# Patient Record
Sex: Female | Born: 1955 | Race: White | Hispanic: No | Marital: Married | Smoking: Never smoker
Health system: Southern US, Community
[De-identification: ages and names within clinical notes are randomized; demographics above are authoritative.]

## PROBLEM LIST (undated history)

## (undated) DIAGNOSIS — L570 Actinic keratosis: Secondary | ICD-10-CM

## (undated) DIAGNOSIS — E079 Disorder of thyroid, unspecified: Secondary | ICD-10-CM

## (undated) DIAGNOSIS — C4490 Unspecified malignant neoplasm of skin, unspecified: Secondary | ICD-10-CM

## (undated) DIAGNOSIS — G43909 Migraine, unspecified, not intractable, without status migrainosus: Secondary | ICD-10-CM

## (undated) DIAGNOSIS — R42 Dizziness and giddiness: Secondary | ICD-10-CM

## (undated) DIAGNOSIS — I10 Essential (primary) hypertension: Secondary | ICD-10-CM

## (undated) HISTORY — PX: CHOLECYSTECTOMY: SHX55

## (undated) HISTORY — DX: Unspecified malignant neoplasm of skin, unspecified: C44.90

## (undated) HISTORY — DX: Actinic keratosis: L57.0

---

## 2001-01-04 ENCOUNTER — Other Ambulatory Visit: Admission: RE | Admit: 2001-01-04 | Discharge: 2001-01-04 | Payer: Self-pay | Admitting: *Deleted

## 2003-03-16 ENCOUNTER — Encounter: Payer: Self-pay | Admitting: Unknown Physician Specialty

## 2003-03-16 ENCOUNTER — Ambulatory Visit (HOSPITAL_COMMUNITY): Admission: RE | Admit: 2003-03-16 | Discharge: 2003-03-16 | Payer: Self-pay | Admitting: Unknown Physician Specialty

## 2005-06-01 ENCOUNTER — Emergency Department (HOSPITAL_COMMUNITY): Admission: EM | Admit: 2005-06-01 | Discharge: 2005-06-01 | Payer: Self-pay | Admitting: Emergency Medicine

## 2005-12-12 ENCOUNTER — Ambulatory Visit (HOSPITAL_COMMUNITY): Admission: RE | Admit: 2005-12-12 | Discharge: 2005-12-12 | Payer: Self-pay | Admitting: Family Medicine

## 2006-12-15 ENCOUNTER — Ambulatory Visit (HOSPITAL_COMMUNITY): Admission: RE | Admit: 2006-12-15 | Discharge: 2006-12-15 | Payer: Self-pay | Admitting: Unknown Physician Specialty

## 2007-08-20 ENCOUNTER — Ambulatory Visit (HOSPITAL_COMMUNITY): Admission: RE | Admit: 2007-08-20 | Discharge: 2007-08-20 | Payer: Self-pay | Admitting: Unknown Physician Specialty

## 2007-10-01 ENCOUNTER — Ambulatory Visit (HOSPITAL_COMMUNITY): Admission: RE | Admit: 2007-10-01 | Discharge: 2007-10-01 | Payer: Self-pay | Admitting: Family Medicine

## 2007-12-21 ENCOUNTER — Ambulatory Visit (HOSPITAL_COMMUNITY): Admission: RE | Admit: 2007-12-21 | Discharge: 2007-12-21 | Payer: Self-pay | Admitting: Unknown Physician Specialty

## 2008-12-27 ENCOUNTER — Ambulatory Visit (HOSPITAL_COMMUNITY): Admission: RE | Admit: 2008-12-27 | Discharge: 2008-12-27 | Payer: Self-pay | Admitting: Unknown Physician Specialty

## 2009-12-19 ENCOUNTER — Encounter: Payer: Self-pay | Admitting: Internal Medicine

## 2009-12-31 ENCOUNTER — Ambulatory Visit (HOSPITAL_COMMUNITY): Admission: RE | Admit: 2009-12-31 | Discharge: 2009-12-31 | Payer: Self-pay | Admitting: Unknown Physician Specialty

## 2010-01-07 ENCOUNTER — Encounter (INDEPENDENT_AMBULATORY_CARE_PROVIDER_SITE_OTHER): Payer: Self-pay | Admitting: *Deleted

## 2010-02-01 ENCOUNTER — Ambulatory Visit (HOSPITAL_COMMUNITY): Admission: RE | Admit: 2010-02-01 | Discharge: 2010-02-01 | Payer: Self-pay | Admitting: Internal Medicine

## 2010-02-06 ENCOUNTER — Ambulatory Visit (HOSPITAL_COMMUNITY): Admission: RE | Admit: 2010-02-06 | Discharge: 2010-02-06 | Payer: Self-pay | Admitting: Internal Medicine

## 2010-02-21 ENCOUNTER — Ambulatory Visit (HOSPITAL_COMMUNITY): Admission: RE | Admit: 2010-02-21 | Discharge: 2010-02-21 | Payer: Self-pay | Admitting: Internal Medicine

## 2010-02-22 ENCOUNTER — Ambulatory Visit: Payer: Self-pay | Admitting: Internal Medicine

## 2010-02-22 DIAGNOSIS — K5900 Constipation, unspecified: Secondary | ICD-10-CM | POA: Insufficient documentation

## 2010-04-16 ENCOUNTER — Ambulatory Visit: Payer: Self-pay | Admitting: Internal Medicine

## 2010-04-17 ENCOUNTER — Encounter: Payer: Self-pay | Admitting: Internal Medicine

## 2010-06-30 ENCOUNTER — Encounter: Payer: Self-pay | Admitting: Internal Medicine

## 2010-07-09 NOTE — Letter (Signed)
Summary: Patient Notice- Polyp Results  [facility]  [address] [place], [place] [address]   Phone: [phone]  Fax: [phone]        April 17, 2010 MRN: [id number]    [name] [address] [place], [place]  [address]    Dear Ms. Difrancesco,  I am pleased to inform you that the colon polyp(s) removed during your recent colonoscopy was (were) found to be benign (no cancer detected) upon pathologic examination.  I recommend you have a repeat colonoscopy examination in 10 years to look for recurrent polyps, as having colon polyps increases your risk for having recurrent polyps or even colon cancer in the future.  Should you develop new or worsening symptoms of abdominal pain, bowel habit changes or bleeding from the rectum or bowels, please schedule an evaluation with either your primary care physician or with me.  Additional information/recommendations:  __ No further action with gastroenterology is needed at this time. Please      follow-up with your primary care physician for your other healthcare      needs.    Please call us if you are having persistent problems or have questions about your condition that have not been fully answered at this time.  Sincerely,  Hilarie Fredrickson MD  This letter has been electronically signed by your physician.  Appended Document: Patient Notice- Polyp Results letter mailed

## 2010-07-09 NOTE — Letter (Signed)
Summary: New Patient letter  [facility]  [address] [place], [place] [address]   Phone: [phone]  Fax: [phone]       01/07/2010 MRN: [id number]  Shelly Wood [address] [place], [place]  [address]  Dear Shelly Wood,  Welcome to the Gastroenterology Division at [facility].    You are scheduled to see Dr. Marina Goodell on 9/16/2011at 3:30PM on the 3rd floor at [facility], [address]  We ask that you try to arrive at our office 15 minutes prior to your appointment time to allow for check-in.  We would like you to complete the enclosed self-administered evaluation form prior to your visit and bring it with you on the day of your appointment.  We will review it with you.  Also, please bring a complete list of all your medications or, if you prefer, bring the medication bottles and we will list them.  Please bring your insurance card so that we may make a copy of it.  If your insurance requires a referral to see a specialist, please bring your referral form from your primary care physician.  Co-payments are due at the time of your visit and may be paid by cash, check or credit card.     Your office visit will consist of a consult with your physician (includes a physical exam), any laboratory testing he/she may order, scheduling of any necessary diagnostic testing (e.g. x-ray, ultrasound, CT-scan), and scheduling of a procedure (e.g. Endoscopy, Colonoscopy) if required.  Please allow enough time on your schedule to allow for any/all of these possibilities.    If you cannot keep your appointment, please call [phone] to cancel or reschedule prior to your appointment date.  This allows [place] the opportunity to schedule an appointment for another patient in need of care.  If you do not cancel or reschedule by 5 p.m. the business day prior to your appointment date, you will be charged a $50.00 late cancellation/no-show fee.    Thank you for choosing  [facility] for your medical needs.  We appreciate the opportunity to care for you.  Please visit [place] at our website  to learn more about our practice.                     Sincerely,                                                             The Gastroenterology Division

## 2010-07-09 NOTE — Letter (Signed)
Summary: [facility] Instructions  [facility]  [address] [place], [place] [address]   Phone: [phone]  Fax: [phone]       Shelly Wood    [birth year]    MRN: [id number]        Procedure Day /Date:TUESDAY, 04/16/10     Arrival Time:10:00 AM     Procedure Time:11:00 AM     Location of Procedure:                    X  [facility] (4th Floor)       PREPARATION FOR COLONOSCOPY WITH MOVIPREP   Starting 5 days prior to your procedure 11/3/11do not eat nuts, seeds, popcorn, corn, beans, peas,  salads, or any raw vegetables.  Do not take any fiber supplements (e.g. Metamucil, Citrucel, and Benefiber).  THE DAY BEFORE YOUR PROCEDURE         DATE:04/15/10  DAY: MONDAY  1.  Drink clear liquids the entire day-NO SOLID FOOD  2.  Do not drink anything colored red or purple.  Avoid juices with pulp.  No orange juice.  3.  Drink at least 64 oz. (8 glasses) of fluid/clear liquids during the day to prevent dehydration and help the prep work efficiently.  CLEAR LIQUIDS INCLUDE: Water Jello Ice Popsicles Tea (sugar ok, no milk/cream) Powdered fruit flavored drinks Coffee (sugar ok, no milk/cream) Gatorade Juice: apple, white grape, white cranberry  Lemonade Clear bullion, consomm, broth Carbonated beverages (any kind) Strained chicken noodle soup Hard Candy                             4.  In the morning, mix first dose of MoviPrep solution:    Empty 1 Pouch A and 1 Pouch B into the disposable container    Add lukewarm drinking water to the top line of the container. Mix to dissolve    Refrigerate (mixed solution should be used within 24 hrs)  5.  Begin drinking the prep at 5:00 p.m. The MoviPrep container is divided by 4 marks.   Every 15 minutes drink the solution down to the next mark (approximately 8 oz) until the full liter is complete.   6.  Follow completed prep with 16 oz of clear liquid of your choice (Nothing red or purple).  Continue  to drink clear liquids until bedtime.  7.  Before going to bed, mix second dose of MoviPrep solution:    Empty 1 Pouch A and 1 Pouch B into the disposable container    Add lukewarm drinking water to the top line of the container. Mix to dissolve    Refrigerate  THE DAY OF YOUR PROCEDURE      DATE: 11/8/11DAY: TUESDAY  Beginning at 6:00 am (5 hours before procedure):         1. Every 15 minutes, drink the solution down to the next mark (approx 8 oz) until the full liter is complete.  2. Follow completed prep with 16 oz. of clear liquid of your choice.    3. You may drink clear liquids until 9;00 AM (2 HOURS BEFORE PROCEDURE).   MEDICATION INSTRUCTIONS  Unless otherwise instructed, you should take regular prescription medications with a small sip of water   as early as possible the morning of your procedure.         OTHER INSTRUCTIONS  You will need a responsible adult at least 55 years of  age to accompany you and drive you home.   This person must remain in the waiting room during your procedure.  Wear loose fitting clothing that is easily removed.  Leave jewelry and other valuables at home.  However, you may wish to bring a book to read or  an iPod/MP3 player to listen to music as you wait for your procedure to start.  Remove all body piercing jewelry and leave at home.  Total time from sign-in until discharge is approximately 2-3 hours.  You should go home directly after your procedure and rest.  You can resume normal activities the  day after your procedure.  The day of your procedure you should not:   Drive   Make legal decisions   Operate machinery   Drink alcohol   Return to work  You will receive specific instructions about eating, activities and medications before you leave.    The above instructions have been reviewed and explained to me by   _______________________    I fully understand and can verbalize these instructions  _____________________________ Date _________

## 2010-07-09 NOTE — Procedures (Signed)
Summary: Colonoscopy  Patient: Shelly Wood Note: All result statuses are Final unless otherwise noted.  Tests: (1) Colonoscopy (COL)   COL Colonoscopy           DONE     [facility]     [address]     [place], [place]  [address]           COLONOSCOPY PROCEDURE REPORT           PATIENT:  Shelly, Wood  MR#:  [id number]     BIRTHDATE:  1956-04-12, 54 yrs. old  GENDER:  female     ENDOSCOPIST:  Wilhemina Bonito. Eda Keys, MD     REF. BY:  Artis Delay, M.D.     PROCEDURE DATE:  04/16/2010     PROCEDURE:  Colonoscopy with snare polypectomy x1     ASA CLASS:  Class II     INDICATIONS:  Routine Risk Screening     MEDICATIONS:   Fentanyl 100 mcg IV, Versed 9 mg IV, Benadryl 25 mg     IV           DESCRIPTION OF PROCEDURE:   After the risks benefits and     alternatives of the procedure were thoroughly explained, informed     consent was obtained.  Digital rectal exam was performed and     revealed no abnormalities.   The LB 180AL [id number] endoscope was     introduced through the anus and advanced to the cecum, which was     identified by both the appendix and ileocecal valve, without     limitations.Time to cecum =2:27 min.  The quality of the prep was     excellent, using MoviPrep.  The instrument was then slowly     withdrawn (time = 11:44 min) as the colon was fully examined.     <<PROCEDUREIMAGES>>           FINDINGS:  A diminutive polyp was found in the mid transverse     colon. Polyp was snared without cautery. Retrieval was successful.     snare polyp  This was otherwise a normal examination of the colon.     Retroflexed views in the rectum revealed no abnormalities.    The     scope was then withdrawn from the patient and the procedure     completed.           COMPLICATIONS:  None           ENDOSCOPIC IMPRESSION:     1) Diminutive polyp in the mid transverse colon - removed     2) Otherwise normal examination           RECOMMENDATIONS:     1) Repeat colonoscopy in 5  years if polyp adenomatous; otherwise     10 years           ______________________________     Wilhemina Bonito. Eda Keys, MD           CC:  Elfredia Nevins, MD; The Patient           n.     eSIGNED:   Wilhemina Bonito. Eda Keys at 04/16/2010 12:29 PM           Viviana Simpler, [id number]  Note: An exclamation mark (!) indicates a result that was not dispersed into the flowsheet. Document Creation Date: 04/16/2010 12:29 PM _______________________________________________________________________  (1) Order result status: Final Collection or observation date-time: 04/16/2010 12:22 Requested date-time:  Receipt date-time:  Reported date-time:  Referring Physician:   Ordering Physician: Fransico Setters [phone]) Specimen Source:  Source: Launa Grill Order Number: [id number] Lab site:   Appended Document: Colonoscopy     Procedures Next Due Date:    Colonoscopy: 04/2015  Appended Document: Colonoscopy     Procedures Next Due Date:    Colonoscopy: 04/2020

## 2010-07-09 NOTE — Assessment & Plan Note (Signed)
Summary: CONSTIPATION / COLONOSCOPY   History of Present Illness Visit Type: new patient Primary GI MD: Yancey Flemings MD Primary Provider: Artis Delay, MD Chief Complaint: constipation History of Present Illness:   55 year old white female with a history of hyperlipidemia, hypothyroidism, and anxiety. She presents today regarding chronic constipation and screening colonoscopy. She is accompanied by her husband. She reports long-standing intermittent problems with mild constipation for which she occasionally takes laxatives. No associated abdominal pain. No bleeding. No change in weight. She denies family history of colon cancer.   GI Review of Systems      Denies abdominal pain, acid reflux, belching, bloating, chest pain, dysphagia with liquids, dysphagia with solids, heartburn, loss of appetite, nausea, vomiting, vomiting blood, weight loss, and  weight gain.      Reports constipation.    Preventive Screening-Counseling & Management  Alcohol-Tobacco     Smoking Status: quit      Drug Use:  no.      Current Medications (verified): 1)  Aspirin 81 Mg Tabs (Aspirin) .... Take 1 Tablet By Mouth Once A Day 2)  Levothyroxine Sodium 75 Mcg Tabs (Levothyroxine Sodium) .... Take 1 Tablet By Mouth Once A Day 3)  Venlafaxine Hcl 150 Mg Xr24h-Tab (Venlafaxine Hcl) .... Take 1 Tablet By Mouth Once A Day 4)  Diazepam 10 Mg Tabs (Diazepam) .... Take 1 Tablet By Mouth Once A Day 5)  Hydrocodone-Acetaminophen 7.5-650 Mg Tabs (Hydrocodone-Acetaminophen) .... Take 1 Tablet By Mouth As Needed 6)  Fioricet 50-325-40 Mg Tabs (Butalbital-Apap-Caffeine) .... Take As Needed 7)  Lovaza 1 Gm Caps (Omega-3-Acid Ethyl Esters) .... Take 1 Capsule By Mouth Two Times A Day  Allergies (verified): No Known Drug Allergies  Past History:  Past Medical History: Hyperlipidemia Hypothyroidism  Past Surgical History: Cholecystectomy Tubal Ligation  Family History: Family History of Breast Cancer: Mat.  Aunt No FH of Colon Cancer:  Social History: Married, 1 boy Antek Patient is a former smoker.  Alcohol Use - no Daily Caffeine Use--coffee Illicit Drug Use - no Smoking Status:  quit Drug Use:  no  Review of Systems  The patient denies allergy/sinus, anemia, anxiety-new, arthritis/joint pain, back pain, blood in urine, breast changes/lumps, confusion, cough, coughing up blood, depression-new, fainting, fatigue, fever, headaches-new, hearing problems, heart murmur, heart rhythm changes, itching, menstrual pain, muscle pains/cramps, night sweats, nosebleeds, pregnancy symptoms, shortness of breath, skin rash, sleeping problems, sore throat, swelling of feet/legs, swollen lymph glands, thirst - excessive, urination - excessive, urination changes/pain, urine leakage, vision changes, and voice change.    Vital Signs:  Patient profile:   55 year old female Height:      67 inches Weight:      157 pounds BMI:     24.68 Pulse rate:   76 / minute Pulse rhythm:   regular BP sitting:   114 / 80  (left arm) Cuff size:   regular  Vitals Entered By: Francee Piccolo CMA Duncan Dull) (February 22, 2010 3:10 PM)  Physical Exam  General:  Well developed, well nourished, no acute distress. Head:  Normocephalic and atraumatic. Eyes:  PERRLA, no icterus. Ears:  Normal auditory acuity. Nose:  No deformity, discharge,  or lesions. Mouth:  No deformity or lesions, dentition normal. Neck:  Supple; no masses or thyromegaly. Lungs:  Clear throughout to auscultation. Heart:  Regular rate and rhythm; no murmurs, rubs,  or bruits. Abdomen:  Soft, nontender and nondistended. No masses, hepatosplenomegaly or hernias noted. Normal bowel sounds. Msk:  normal posture Pulses:  Normal  pulses noted. Extremities:  no edema Neurologic:  alert oriented Skin:  no jaundice Psych:  Alert and cooperative. Normal mood and affect.   Impression & Recommendations:  Problem # 1:  CONSTIPATION (ICD-564.00) mild  functional constipation. Discussed constipation.  Plan: #1. Increase water and fiber #2. Stool softeners #3. Gentle laxative p.r.n. #4. Brochure on constipation provided  Problem # 2:  SPECIAL SCREENING FOR MALIGNANT NEOPLASMS COLON (ICD-V76.51) the patient is at baseline risk. She is an appropriate candidate for screening colonoscopy without contraindication. The nature of the procedure as well as the risks, benefits, and alternatives were reviewed. She understood and agreed to proceed. Movi prep prescribed. The patient instructed on its use  Other Orders: Colonoscopy (Colon)  Patient Instructions: 1)  Colonoscopy [facility] 04/16/10 11:00 am arrive at 10:00 am 2)  Movi prep instructions given to patient/Rx. sent to pharmacy. 3)  Constipation brochure given.  4)  Colonoscopy and Flexible Sigmoidoscopy brochure given.  5)  Copy sent to : Artis Delay, MD 6)  The medication list was reviewed and reconciled.  All changed / newly prescribed medications were explained.  A complete medication list was provided to the patient / caregiver. Prescriptions: MOVIPREP 100 GM  SOLR (PEG-KCL-NACL-NASULF-NA ASC-C) As per prep instructions.  #1 x 0   Entered by:   Milford Cage NCMA   Authorized by:   Hilarie Fredrickson MD   Signed by:   Milford Cage NCMA on 02/22/2010   Method used:   Electronically to        [facility] (retail)       [address]       [place], [place]  [address]       Ph: [phone] or [phone]       Fax: [phone]   RxID:   [id number]

## 2010-07-09 NOTE — Letter (Signed)
Summary: Internal Other/triage  Internal Other/triage   Imported By: Cloria Spring LPN 16/03/9603 54:09:81  _____________________________________________________________________  External Attachment:    Type:   Image     Comment:   External Document

## 2011-01-13 ENCOUNTER — Other Ambulatory Visit (HOSPITAL_COMMUNITY): Payer: Self-pay | Admitting: Unknown Physician Specialty

## 2011-01-13 DIAGNOSIS — Z139 Encounter for screening, unspecified: Secondary | ICD-10-CM

## 2011-01-20 ENCOUNTER — Ambulatory Visit (HOSPITAL_COMMUNITY)
Admission: RE | Admit: 2011-01-20 | Discharge: 2011-01-20 | Disposition: A | Discharge status: Home / Self Care | Payer: Managed Care, Other (non HMO) | Source: Ambulatory Visit | Attending: Unknown Physician Specialty | Admitting: Unknown Physician Specialty

## 2011-01-20 DIAGNOSIS — Z139 Encounter for screening, unspecified: Secondary | ICD-10-CM

## 2011-01-20 DIAGNOSIS — Z1231 Encounter for screening mammogram for malignant neoplasm of breast: Secondary | ICD-10-CM | POA: Insufficient documentation

## 2012-02-19 ENCOUNTER — Other Ambulatory Visit (HOSPITAL_COMMUNITY): Payer: Self-pay | Admitting: Unknown Physician Specialty

## 2012-02-19 DIAGNOSIS — Z139 Encounter for screening, unspecified: Secondary | ICD-10-CM

## 2012-03-15 ENCOUNTER — Ambulatory Visit (HOSPITAL_COMMUNITY)
Admission: RE | Admit: 2012-03-15 | Discharge: 2012-03-15 | Disposition: A | Discharge status: Home / Self Care | Payer: Managed Care, Other (non HMO) | Source: Ambulatory Visit | Attending: Unknown Physician Specialty | Admitting: Unknown Physician Specialty

## 2012-03-15 DIAGNOSIS — Z1231 Encounter for screening mammogram for malignant neoplasm of breast: Secondary | ICD-10-CM | POA: Insufficient documentation

## 2012-03-15 DIAGNOSIS — Z139 Encounter for screening, unspecified: Secondary | ICD-10-CM

## 2012-10-11 ENCOUNTER — Other Ambulatory Visit (HOSPITAL_COMMUNITY): Payer: Self-pay | Admitting: Internal Medicine

## 2012-10-11 DIAGNOSIS — R1013 Epigastric pain: Secondary | ICD-10-CM

## 2012-10-13 ENCOUNTER — Ambulatory Visit (HOSPITAL_COMMUNITY)
Admission: RE | Admit: 2012-10-13 | Discharge: 2012-10-13 | Disposition: A | Discharge status: Home / Self Care | Payer: Managed Care, Other (non HMO) | Source: Ambulatory Visit | Attending: Internal Medicine | Admitting: Internal Medicine

## 2012-10-13 DIAGNOSIS — R1013 Epigastric pain: Secondary | ICD-10-CM | POA: Insufficient documentation

## 2012-10-14 ENCOUNTER — Other Ambulatory Visit (HOSPITAL_COMMUNITY): Payer: Managed Care, Other (non HMO)

## 2012-10-15 ENCOUNTER — Other Ambulatory Visit (HOSPITAL_COMMUNITY): Payer: Self-pay | Admitting: Internal Medicine

## 2012-10-15 DIAGNOSIS — R1011 Right upper quadrant pain: Secondary | ICD-10-CM

## 2012-10-15 DIAGNOSIS — R319 Hematuria, unspecified: Secondary | ICD-10-CM

## 2012-10-19 ENCOUNTER — Encounter (HOSPITAL_COMMUNITY): Payer: Self-pay

## 2012-10-19 ENCOUNTER — Ambulatory Visit (HOSPITAL_COMMUNITY)
Admission: RE | Admit: 2012-10-19 | Discharge: 2012-10-19 | Disposition: A | Discharge status: Home / Self Care | Payer: Managed Care, Other (non HMO) | Source: Ambulatory Visit | Attending: Internal Medicine | Admitting: Internal Medicine

## 2012-10-19 DIAGNOSIS — R1031 Right lower quadrant pain: Secondary | ICD-10-CM | POA: Insufficient documentation

## 2012-10-19 DIAGNOSIS — R1011 Right upper quadrant pain: Secondary | ICD-10-CM | POA: Insufficient documentation

## 2012-10-19 DIAGNOSIS — I10 Essential (primary) hypertension: Secondary | ICD-10-CM | POA: Insufficient documentation

## 2012-10-19 DIAGNOSIS — R319 Hematuria, unspecified: Secondary | ICD-10-CM

## 2012-10-19 HISTORY — DX: Essential (primary) hypertension: I10

## 2012-10-19 MED ORDER — IOHEXOL 300 MG/ML  SOLN
100.0000 mL | Freq: Once | INTRAMUSCULAR | Status: AC | PRN
  Administered 2012-10-19: 100 mL via INTRAVENOUS

## 2013-03-23 ENCOUNTER — Other Ambulatory Visit (HOSPITAL_COMMUNITY): Payer: Self-pay | Admitting: Unknown Physician Specialty

## 2013-03-23 DIAGNOSIS — Z139 Encounter for screening, unspecified: Secondary | ICD-10-CM

## 2013-04-04 ENCOUNTER — Ambulatory Visit (HOSPITAL_COMMUNITY): Payer: Managed Care, Other (non HMO)

## 2013-04-18 ENCOUNTER — Ambulatory Visit (HOSPITAL_COMMUNITY)
Admission: RE | Admit: 2013-04-18 | Discharge: 2013-04-18 | Disposition: A | Discharge status: Home / Self Care | Payer: Managed Care, Other (non HMO) | Source: Ambulatory Visit | Attending: Unknown Physician Specialty | Admitting: Unknown Physician Specialty

## 2013-04-18 DIAGNOSIS — Z139 Encounter for screening, unspecified: Secondary | ICD-10-CM

## 2013-04-18 DIAGNOSIS — Z1231 Encounter for screening mammogram for malignant neoplasm of breast: Secondary | ICD-10-CM | POA: Insufficient documentation

## 2014-04-12 ENCOUNTER — Other Ambulatory Visit (HOSPITAL_COMMUNITY): Payer: Self-pay | Admitting: Unknown Physician Specialty

## 2014-04-12 DIAGNOSIS — Z1231 Encounter for screening mammogram for malignant neoplasm of breast: Secondary | ICD-10-CM

## 2014-04-24 ENCOUNTER — Ambulatory Visit (HOSPITAL_COMMUNITY)
Admission: RE | Admit: 2014-04-24 | Discharge: 2014-04-24 | Disposition: A | Discharge status: Home / Self Care | Payer: Managed Care, Other (non HMO) | Source: Ambulatory Visit | Attending: Unknown Physician Specialty | Admitting: Unknown Physician Specialty

## 2014-04-24 DIAGNOSIS — Z1231 Encounter for screening mammogram for malignant neoplasm of breast: Secondary | ICD-10-CM

## 2014-07-27 ENCOUNTER — Ambulatory Visit (INDEPENDENT_AMBULATORY_CARE_PROVIDER_SITE_OTHER): Payer: Managed Care, Other (non HMO) | Admitting: Otolaryngology

## 2014-07-27 DIAGNOSIS — H903 Sensorineural hearing loss, bilateral: Secondary | ICD-10-CM

## 2014-07-27 DIAGNOSIS — R42 Dizziness and giddiness: Secondary | ICD-10-CM

## 2014-08-01 ENCOUNTER — Other Ambulatory Visit (INDEPENDENT_AMBULATORY_CARE_PROVIDER_SITE_OTHER): Payer: Self-pay | Admitting: Otolaryngology

## 2014-08-01 DIAGNOSIS — R42 Dizziness and giddiness: Secondary | ICD-10-CM

## 2014-08-08 ENCOUNTER — Ambulatory Visit (HOSPITAL_COMMUNITY)
Admission: RE | Admit: 2014-08-08 | Discharge: 2014-08-08 | Disposition: A | Discharge status: Home / Self Care | Payer: Managed Care, Other (non HMO) | Source: Ambulatory Visit | Attending: Otolaryngology | Admitting: Otolaryngology

## 2014-08-08 DIAGNOSIS — G939 Disorder of brain, unspecified: Secondary | ICD-10-CM | POA: Diagnosis not present

## 2014-08-08 DIAGNOSIS — R42 Dizziness and giddiness: Secondary | ICD-10-CM | POA: Diagnosis present

## 2014-08-08 LAB — POCT I-STAT CREATININE: CREATININE: 0.9 mg/dL (ref 0.50–1.10)

## 2014-08-08 MED ORDER — GADOBENATE DIMEGLUMINE 529 MG/ML IV SOLN
14.0000 mL | Freq: Once | INTRAVENOUS | Status: AC | PRN
  Administered 2014-08-08: 14 mL via INTRAVENOUS

## 2014-08-24 ENCOUNTER — Ambulatory Visit (INDEPENDENT_AMBULATORY_CARE_PROVIDER_SITE_OTHER): Payer: Managed Care, Other (non HMO) | Admitting: Otolaryngology

## 2014-08-24 DIAGNOSIS — H903 Sensorineural hearing loss, bilateral: Secondary | ICD-10-CM

## 2014-08-24 DIAGNOSIS — R42 Dizziness and giddiness: Secondary | ICD-10-CM | POA: Diagnosis not present

## 2014-11-16 ENCOUNTER — Ambulatory Visit (INDEPENDENT_AMBULATORY_CARE_PROVIDER_SITE_OTHER): Payer: Managed Care, Other (non HMO) | Admitting: Otolaryngology

## 2015-04-03 ENCOUNTER — Other Ambulatory Visit (HOSPITAL_COMMUNITY): Payer: Self-pay | Admitting: Unknown Physician Specialty

## 2015-04-03 DIAGNOSIS — Z1231 Encounter for screening mammogram for malignant neoplasm of breast: Secondary | ICD-10-CM

## 2015-04-30 ENCOUNTER — Ambulatory Visit (HOSPITAL_COMMUNITY)
Admission: RE | Admit: 2015-04-30 | Discharge: 2015-04-30 | Disposition: A | Discharge status: Home / Self Care | Payer: Managed Care, Other (non HMO) | Source: Ambulatory Visit | Attending: Unknown Physician Specialty | Admitting: Unknown Physician Specialty

## 2015-04-30 DIAGNOSIS — Z1231 Encounter for screening mammogram for malignant neoplasm of breast: Secondary | ICD-10-CM | POA: Diagnosis present

## 2015-10-17 ENCOUNTER — Encounter: Payer: Self-pay | Admitting: Internal Medicine

## 2016-03-28 ENCOUNTER — Other Ambulatory Visit (HOSPITAL_COMMUNITY): Payer: Self-pay | Admitting: Internal Medicine

## 2016-03-28 DIAGNOSIS — Z1231 Encounter for screening mammogram for malignant neoplasm of breast: Secondary | ICD-10-CM

## 2016-05-05 ENCOUNTER — Ambulatory Visit (HOSPITAL_COMMUNITY): Payer: Managed Care, Other (non HMO)

## 2016-06-16 ENCOUNTER — Other Ambulatory Visit (HOSPITAL_COMMUNITY): Payer: Self-pay | Admitting: Unknown Physician Specialty

## 2016-06-16 DIAGNOSIS — Z1231 Encounter for screening mammogram for malignant neoplasm of breast: Secondary | ICD-10-CM

## 2016-06-30 ENCOUNTER — Ambulatory Visit (HOSPITAL_COMMUNITY)
Admission: RE | Admit: 2016-06-30 | Discharge: 2016-06-30 | Disposition: A | Discharge status: Home / Self Care | Payer: Managed Care, Other (non HMO) | Source: Ambulatory Visit | Attending: Unknown Physician Specialty | Admitting: Unknown Physician Specialty

## 2016-06-30 DIAGNOSIS — Z1231 Encounter for screening mammogram for malignant neoplasm of breast: Secondary | ICD-10-CM

## 2016-12-25 ENCOUNTER — Encounter (HOSPITAL_COMMUNITY): Payer: Self-pay

## 2016-12-25 ENCOUNTER — Emergency Department (HOSPITAL_COMMUNITY): Payer: 59

## 2016-12-25 ENCOUNTER — Emergency Department (HOSPITAL_COMMUNITY)
Admission: EM | Admit: 2016-12-25 | Discharge: 2016-12-25 | Disposition: A | Discharge status: Home / Self Care | Payer: 59 | Attending: Emergency Medicine | Admitting: Emergency Medicine

## 2016-12-25 DIAGNOSIS — I10 Essential (primary) hypertension: Secondary | ICD-10-CM | POA: Insufficient documentation

## 2016-12-25 DIAGNOSIS — E079 Disorder of thyroid, unspecified: Secondary | ICD-10-CM | POA: Insufficient documentation

## 2016-12-25 DIAGNOSIS — G43909 Migraine, unspecified, not intractable, without status migrainosus: Secondary | ICD-10-CM | POA: Insufficient documentation

## 2016-12-25 DIAGNOSIS — R55 Syncope and collapse: Secondary | ICD-10-CM | POA: Diagnosis present

## 2016-12-25 DIAGNOSIS — R5383 Other fatigue: Secondary | ICD-10-CM | POA: Diagnosis not present

## 2016-12-25 DIAGNOSIS — H81399 Other peripheral vertigo, unspecified ear: Secondary | ICD-10-CM

## 2016-12-25 DIAGNOSIS — G43809 Other migraine, not intractable, without status migrainosus: Secondary | ICD-10-CM

## 2016-12-25 HISTORY — DX: Migraine, unspecified, not intractable, without status migrainosus: G43.909

## 2016-12-25 HISTORY — DX: Disorder of thyroid, unspecified: E07.9

## 2016-12-25 HISTORY — DX: Dizziness and giddiness: R42

## 2016-12-25 LAB — URINALYSIS, ROUTINE W REFLEX MICROSCOPIC
Bilirubin Urine: NEGATIVE
GLUCOSE, UA: NEGATIVE mg/dL
Hgb urine dipstick: NEGATIVE
KETONES UR: NEGATIVE mg/dL
LEUKOCYTES UA: NEGATIVE
Nitrite: NEGATIVE
PH: 6 (ref 5.0–8.0)
Protein, ur: NEGATIVE mg/dL
Specific Gravity, Urine: 1.009 (ref 1.005–1.030)

## 2016-12-25 LAB — CBC
HEMATOCRIT: 43.1 % (ref 36.0–46.0)
Hemoglobin: 14 g/dL (ref 12.0–15.0)
MCH: 30.6 pg (ref 26.0–34.0)
MCHC: 32.5 g/dL (ref 30.0–36.0)
MCV: 94.1 fL (ref 78.0–100.0)
Platelets: 260 10*3/uL (ref 150–400)
RBC: 4.58 MIL/uL (ref 3.87–5.11)
RDW: 14 % (ref 11.5–15.5)
WBC: 7.3 10*3/uL (ref 4.0–10.5)

## 2016-12-25 LAB — HEPATIC FUNCTION PANEL
ALT: 36 U/L (ref 14–54)
AST: 38 U/L (ref 15–41)
Albumin: 3.5 g/dL (ref 3.5–5.0)
Alkaline Phosphatase: 51 U/L (ref 38–126)
Bilirubin, Direct: <0.1 mg/dL — ABNORMAL LOW (ref 0.1–0.5)
TOTAL PROTEIN: 6.8 g/dL (ref 6.5–8.1)
Total Bilirubin: 0.5 mg/dL (ref 0.3–1.2)

## 2016-12-25 LAB — BASIC METABOLIC PANEL
Anion gap: 7 (ref 5–15)
BUN: 16 mg/dL (ref 6–20)
CHLORIDE: 103 mmol/L (ref 101–111)
CO2: 30 mmol/L (ref 22–32)
Calcium: 9.1 mg/dL (ref 8.9–10.3)
Creatinine, Ser: 0.94 mg/dL (ref 0.44–1.00)
GFR calc Af Amer: >60 mL/min (ref >60)
GFR calc non Af Amer: >60 mL/min (ref >60)
Glucose, Bld: 108 mg/dL — ABNORMAL HIGH (ref 65–99)
POTASSIUM: 3.6 mmol/L (ref 3.5–5.1)
SODIUM: 140 mmol/L (ref 135–145)

## 2016-12-25 LAB — TROPONIN I: Troponin I: <0.03 ng/mL (ref <0.03)

## 2016-12-25 LAB — CBG MONITORING, ED: Glucose-Capillary: 110 mg/dL — ABNORMAL HIGH (ref 65–99)

## 2016-12-25 LAB — ETHANOL

## 2016-12-25 MED ORDER — DIPHENHYDRAMINE HCL 50 MG/ML IJ SOLN
25.0000 mg | Freq: Once | INTRAMUSCULAR | Status: AC
  Administered 2016-12-25: 25 mg via INTRAVENOUS
  Filled 2016-12-25: qty 1

## 2016-12-25 MED ORDER — DEXAMETHASONE SODIUM PHOSPHATE 4 MG/ML IJ SOLN
8.0000 mg | Freq: Once | INTRAMUSCULAR | Status: AC
  Administered 2016-12-25: 8 mg via INTRAVENOUS
  Filled 2016-12-25: qty 2

## 2016-12-25 MED ORDER — METOCLOPRAMIDE HCL 5 MG/ML IJ SOLN
10.0000 mg | Freq: Once | INTRAMUSCULAR | Status: AC
  Administered 2016-12-25: 10 mg via INTRAVENOUS
  Filled 2016-12-25: qty 2

## 2016-12-25 NOTE — ED Provider Notes (Signed)
[place] DEPT Provider Note   CSN: [id number] Arrival date & time: 12/25/16  2595     History   Chief Complaint Chief Complaint  Patient presents with  . Loss of Consciousness    HPI Shelly Wood is a 61 y.o. female.   Loss of Consciousness     Pt was seen at 0740. Per pt and her family, c/o sudden onset and resolution of one episode of syncope that occurred PTA. Pt states she woke up for work at her usual time (approximately 0530), felt lightheaded, then "passed out." Pt's husband states he found pt on the floor and "somewhat slow to respond." Pt states she now is starting to have acute flair of her chronic migraine headache as well as a flair of her vertigo. Pt states for the past several days her entire left arm has been "hurting" and she "feels weak."  Describes the headache as per her usual chronic migraine headache pain pattern for many years.  Denies headache was sudden or maximal in onset or at any time.  Denies visual changes, no focal motor weakness, no tingling/numbness in extremities, no fevers, no neck pain, no rash. Denies CP/palpitations, no SOB/cough, no abd pain, no N/V/D, no seizure activity, no confusion, no incont bowel/bladder.     Past Medical History:  Diagnosis Date  . Hypertension   . Migraine   . Thyroid disease   . Vertigo     Patient Active Problem List   Diagnosis Date Noted  . CONSTIPATION 02/22/2010    Past Surgical History:  Procedure Laterality Date  . CHOLECYSTECTOMY      OB History    No data available       Home Medications    Prior to Admission medications   Not on File    Family History No family history on file.  Social History Social History  Substance Use Topics  . Smoking status: Never Smoker  . Smokeless tobacco: Never Used  . Alcohol use No     Allergies   Patient has no known allergies.   Review of Systems Review of Systems  Cardiovascular: Positive for syncope.  ROS: Statement: All systems  negative except as marked or noted in the HPI; Constitutional: Negative for fever and chills. ; ; Eyes: Negative for eye pain, redness and discharge. ; ; ENMT: Negative for ear pain, hoarseness, nasal congestion, sinus pressure and sore throat. ; ; Cardiovascular: Negative for chest pain, palpitations, diaphoresis, dyspnea and peripheral edema. ; ; Respiratory: Negative for cough, wheezing and stridor. ; ; Gastrointestinal: Negative for nausea, vomiting, diarrhea, abdominal pain, blood in stool, hematemesis, jaundice and rectal bleeding. . ; ; Genitourinary: Negative for dysuria, flank pain and hematuria. ; ; Musculoskeletal: Negative for back pain and neck pain. Negative for swelling and trauma.; ; Skin: Negative for pruritus, rash, abrasions, blisters, bruising and skin lesion.; ; Neuro: +weakness, lightheadedness, syncope. Negative for neck stiffness. Negative for extremity weakness, paresthesias, involuntary movement, seizure        Physical Exam Updated Vital Signs BP (!) 112/56 (BP Location: Right Arm)   Pulse (!) 52   Temp (!) 97.4 F (36.3 C) (Oral)   Resp 16   SpO2 98%   07:33 Orthostatic Vital Signs JH  Orthostatic Lying   BP- Lying: 129/80  Pulse- Lying: 60      Orthostatic Sitting  BP- Sitting: 125/83  Pulse- Sitting: 54      Orthostatic Standing at 0 minutes  BP- Standing at 0 minutes: 131/81  Pulse- Standing at 0 minutes: 68     Physical Exam 0745: Physical examination:  Nursing notes reviewed; Vital signs and O2 SAT reviewed;  Constitutional: Well developed, Well nourished, Well hydrated, In no acute distress; Head:  Normocephalic, atraumatic; Eyes: EOMI, PERRL, No scleral icterus; ENMT: Mouth and pharynx normal, Mucous membranes moist; Neck: Supple, Full range of motion, No lymphadenopathy; Cardiovascular: Regular rate and rhythm, No gallop; Respiratory: Breath sounds clear & equal bilaterally, No wheezes.  Speaking full sentences with ease, Normal respiratory  effort/excursion; Chest: Nontender, Movement normal; Abdomen: Soft, Nontender, Nondistended, Normal bowel sounds; Genitourinary: No CVA tenderness; Extremities: Pulses normal, No tenderness, No edema, No calf edema or asymmetry.; Neuro: AA&Ox3, Major CN grossly intact. Speech clear.  No facial droop.  +right horizontal end gaze fatigable nystagmus. Grips equal. Strength 5/5 equal bilat UE's and LE's.  DTR 2/4 equal bilat UE's and LE's.  No gross sensory deficits.  Normal cerebellar testing bilat UE's (finger-nose) and LE's (heel-shin)..; Skin: Color normal, Warm, Dry.   ED Treatments / Results  Labs (all labs ordered are listed, but only abnormal results are displayed)   EKG  EKG Interpretation  Date/Time:  Thursday December 25 2016 06:25:14 EDT Ventricular Rate:  61 PR Interval:    QRS Duration: 95 QT Interval:  414 QTC Calculation: 417 R Axis:   110 Text Interpretation:  Sinus rhythm Right axis deviation Low voltage, precordial leads Baseline wander in lead(s) V3 V4 V5 No previous ECGs available Confirmed by Ripley Fraise [phone]) on 12/25/2016 6:44:33 AM       Radiology   Procedures Procedures (including critical care time)  Medications Ordered in ED Medications  diphenhydrAMINE (BENADRYL) injection 25 mg (not administered)  metoCLOPramide (REGLAN) injection 10 mg (not administered)  dexamethasone (DECADRON) injection 8 mg (not administered)     Initial Impression / Assessment and Plan / ED Course  I have reviewed the triage vital signs and the nursing notes.  Pertinent labs & imaging results that were available during my care of the patient were reviewed by me and considered in my medical decision making (see chart for details).  MDM Reviewed: previous chart, nursing note and vitals Reviewed previous: labs and ECG Interpretation: labs, ECG, x-ray and CT scan   Results for orders placed or performed during the hospital encounter of 15/72/62  Basic metabolic panel    Result Value Ref Range   Sodium 140 135 - 145 mmol/L   Potassium 3.6 3.5 - 5.1 mmol/L   Chloride 103 101 - 111 mmol/L   CO2 30 22 - 32 mmol/L   Glucose, Bld 108 (H) 65 - 99 mg/dL   BUN 16 6 - 20 mg/dL   Creatinine, Ser 0.94 0.44 - 1.00 mg/dL   Calcium 9.1 8.9 - 10.3 mg/dL   GFR calc non Af Amer >60 >60 mL/min   GFR calc Af Amer >60 >60 mL/min   Anion gap 7 5 - 15  CBC  Result Value Ref Range   WBC 7.3 4.0 - 10.5 K/uL   RBC 4.58 3.87 - 5.11 MIL/uL   Hemoglobin 14.0 12.0 - 15.0 g/dL   HCT 43.1 36.0 - 46.0 %   MCV 94.1 78.0 - 100.0 fL   MCH 30.6 26.0 - 34.0 pg   MCHC 32.5 30.0 - 36.0 g/dL   RDW 14.0 11.5 - 15.5 %   Platelets 260 150 - 400 K/uL  Urinalysis, Routine w reflex microscopic  Result Value Ref Range   Color, Urine YELLOW YELLOW  APPearance HAZY (A) CLEAR   Specific Gravity, Urine 1.009 1.005 - 1.030   pH 6.0 5.0 - 8.0   Glucose, UA NEGATIVE NEGATIVE mg/dL   Hgb urine dipstick NEGATIVE NEGATIVE   Bilirubin Urine NEGATIVE NEGATIVE   Ketones, ur NEGATIVE NEGATIVE mg/dL   Protein, ur NEGATIVE NEGATIVE mg/dL   Nitrite NEGATIVE NEGATIVE   Leukocytes, UA NEGATIVE NEGATIVE  Troponin I  Result Value Ref Range   Troponin I <0.03 <0.03 ng/mL  Ethanol  Result Value Ref Range   Alcohol, Ethyl (B) <5 <5 mg/dL  Hepatic function panel  Result Value Ref Range   Total Protein 6.8 6.5 - 8.1 g/dL   Albumin 3.5 3.5 - 5.0 g/dL   AST 38 15 - 41 U/L   ALT 36 14 - 54 U/L   Alkaline Phosphatase 51 38 - 126 U/L   Total Bilirubin 0.5 0.3 - 1.2 mg/dL   Bilirubin, Direct <0.1 (L) 0.1 - 0.5 mg/dL   Indirect Bilirubin NOT CALCULATED 0.3 - 0.9 mg/dL  CBG monitoring, ED  Result Value Ref Range   Glucose-Capillary 110 (H) 65 - 99 mg/dL   Dg Chest 2 View Result Date: 12/25/2016 CLINICAL DATA:  Syncope EXAM: CHEST  2 VIEW COMPARISON:  06/11/2012 FINDINGS: Normal heart size. Mild aortic tortuosity. There is no edema, consolidation, effusion, or pneumothorax. No acute osseous finding.  Cholecystectomy clips. IMPRESSION: No evidence of active disease. Electronically Signed   By: Monte Fantasia M.D.   On: 12/25/2016 08:26   Ct Head Wo Contrast Result Date: 12/25/2016 CLINICAL DATA:  Syncope and fall today. EXAM: CT HEAD WITHOUT CONTRAST TECHNIQUE: Contiguous axial images were obtained from the base of the skull through the vertex without intravenous contrast. COMPARISON:  Brain MRI 08/08/2014 FINDINGS: Brain: No evidence of acute infarction, hemorrhage, hydrocephalus, extra-axial collection or mass lesion/mass effect. 2 small areas of cortically based gliotic type appearance at the right frontal parietal junction, likely post ischemic. 1 of these was seen on 2016 brain MRI. Vascular: No hyperdense vessel or unexpected calcification. Skull: Normal. Negative for fracture or focal lesion. Sinuses/Orbits: Negative IMPRESSION: No acute finding. Electronically Signed   By: Monte Fantasia M.D.   On: 12/25/2016 08:01    1010:   Pt is not orthostatic on VS.  Meds given to tx usual migraine headache with improvement.   Pt and family informed re: dx testing results, and that I recommend admission for further evaluation.  Pt refuses admission.  I encouraged pt to stay, continues to refuse.  Pt makes her own medical decisions.  Risks of AMA explained to pt and family, including, but not limited to:  stroke, heart attack, cardiac arrythmia ("irregular heart rate/beat"), "passing out," temporary and/or permanent disability, death.  Pt and family verb understanding and continue to refuse admission, understanding the consequences of their decision.  I encouraged pt to follow up with her PMD tomorrow and return to the ED immediately if symptoms return, or for any other concerns.  Pt and family verb understanding, agreeable.    Final Clinical Impressions(s) / ED Diagnoses   Final diagnoses:  None    New Prescriptions New Prescriptions   No medications on file      Francine Graven,  DO 12/29/16 1818

## 2016-12-25 NOTE — Discharge Instructions (Signed)
Take your usual prescriptions as previously directed.  Call your regular medical doctor today to schedule a follow up appointment within the next 24 to 48 hours.  Return to the Emergency Department immediately sooner if worsening.

## 2016-12-25 NOTE — ED Triage Notes (Signed)
Pt states she got up this am and passed out, states she is not sure if she hit her head.  Her husband states he found pt on the floor and was somewhat slow to respond.  Pt states she feels very weak and is having pain to left arm since last night.

## 2016-12-26 LAB — URINE CULTURE: Culture: NO GROWTH

## 2017-03-09 ENCOUNTER — Ambulatory Visit (INDEPENDENT_AMBULATORY_CARE_PROVIDER_SITE_OTHER): Payer: 59 | Admitting: Otolaryngology

## 2017-09-08 ENCOUNTER — Other Ambulatory Visit (HOSPITAL_COMMUNITY): Payer: Self-pay | Admitting: Unknown Physician Specialty

## 2017-09-08 DIAGNOSIS — Z1231 Encounter for screening mammogram for malignant neoplasm of breast: Secondary | ICD-10-CM

## 2017-09-11 ENCOUNTER — Ambulatory Visit (HOSPITAL_COMMUNITY)
Admission: RE | Admit: 2017-09-11 | Discharge: 2017-09-11 | Disposition: A | Discharge status: Home / Self Care | Payer: 59 | Source: Ambulatory Visit | Attending: Unknown Physician Specialty | Admitting: Unknown Physician Specialty

## 2017-09-11 DIAGNOSIS — Z1231 Encounter for screening mammogram for malignant neoplasm of breast: Secondary | ICD-10-CM

## 2017-11-09 DIAGNOSIS — W57XXXA Bitten or stung by nonvenomous insect and other nonvenomous arthropods, initial encounter: Secondary | ICD-10-CM | POA: Diagnosis not present

## 2017-11-09 DIAGNOSIS — R21 Rash and other nonspecific skin eruption: Secondary | ICD-10-CM | POA: Diagnosis not present

## 2017-11-09 DIAGNOSIS — A6920 Lyme disease, unspecified: Secondary | ICD-10-CM | POA: Diagnosis not present

## 2018-01-25 DIAGNOSIS — Z0000 Encounter for general adult medical examination without abnormal findings: Secondary | ICD-10-CM | POA: Diagnosis not present

## 2018-01-27 DIAGNOSIS — Z6826 Body mass index (BMI) 26.0-26.9, adult: Secondary | ICD-10-CM | POA: Diagnosis not present

## 2018-01-27 DIAGNOSIS — Z01419 Encounter for gynecological examination (general) (routine) without abnormal findings: Secondary | ICD-10-CM | POA: Diagnosis not present

## 2018-01-27 DIAGNOSIS — Z79890 Hormone replacement therapy (postmenopausal): Secondary | ICD-10-CM | POA: Diagnosis not present

## 2018-03-17 ENCOUNTER — Ambulatory Visit: Payer: 59

## 2018-04-01 DIAGNOSIS — Z1389 Encounter for screening for other disorder: Secondary | ICD-10-CM | POA: Diagnosis not present

## 2018-04-01 DIAGNOSIS — Z6827 Body mass index (BMI) 27.0-27.9, adult: Secondary | ICD-10-CM | POA: Diagnosis not present

## 2018-04-01 DIAGNOSIS — H8113 Benign paroxysmal vertigo, bilateral: Secondary | ICD-10-CM | POA: Diagnosis not present

## 2018-04-01 DIAGNOSIS — E063 Autoimmune thyroiditis: Secondary | ICD-10-CM | POA: Diagnosis not present

## 2018-04-01 DIAGNOSIS — I10 Essential (primary) hypertension: Secondary | ICD-10-CM | POA: Diagnosis not present

## 2018-04-20 DIAGNOSIS — Z0000 Encounter for general adult medical examination without abnormal findings: Secondary | ICD-10-CM | POA: Diagnosis not present

## 2018-04-20 DIAGNOSIS — Z1389 Encounter for screening for other disorder: Secondary | ICD-10-CM | POA: Diagnosis not present

## 2018-04-20 DIAGNOSIS — I10 Essential (primary) hypertension: Secondary | ICD-10-CM | POA: Diagnosis not present

## 2018-04-20 DIAGNOSIS — E663 Overweight: Secondary | ICD-10-CM | POA: Diagnosis not present

## 2018-04-20 DIAGNOSIS — F329 Major depressive disorder, single episode, unspecified: Secondary | ICD-10-CM | POA: Diagnosis not present

## 2018-11-03 ENCOUNTER — Other Ambulatory Visit (HOSPITAL_COMMUNITY): Payer: Self-pay | Admitting: Unknown Physician Specialty

## 2018-11-03 DIAGNOSIS — Z1231 Encounter for screening mammogram for malignant neoplasm of breast: Secondary | ICD-10-CM

## 2018-11-08 DIAGNOSIS — D0461 Carcinoma in situ of skin of right upper limb, including shoulder: Secondary | ICD-10-CM | POA: Diagnosis not present

## 2018-11-08 DIAGNOSIS — L718 Other rosacea: Secondary | ICD-10-CM | POA: Diagnosis not present

## 2018-11-10 ENCOUNTER — Other Ambulatory Visit: Payer: Self-pay

## 2018-11-10 ENCOUNTER — Ambulatory Visit (HOSPITAL_COMMUNITY)
Admission: RE | Admit: 2018-11-10 | Discharge: 2018-11-10 | Disposition: A | Discharge status: Home / Self Care | Payer: BC Managed Care – PPO | Source: Ambulatory Visit | Attending: Unknown Physician Specialty | Admitting: Unknown Physician Specialty

## 2018-11-10 DIAGNOSIS — Z1231 Encounter for screening mammogram for malignant neoplasm of breast: Secondary | ICD-10-CM | POA: Insufficient documentation

## 2018-12-09 DIAGNOSIS — Z85828 Personal history of other malignant neoplasm of skin: Secondary | ICD-10-CM | POA: Diagnosis not present

## 2018-12-09 DIAGNOSIS — Z08 Encounter for follow-up examination after completed treatment for malignant neoplasm: Secondary | ICD-10-CM | POA: Diagnosis not present

## 2019-03-16 DIAGNOSIS — Z01419 Encounter for gynecological examination (general) (routine) without abnormal findings: Secondary | ICD-10-CM | POA: Diagnosis not present

## 2019-03-16 DIAGNOSIS — Z6828 Body mass index (BMI) 28.0-28.9, adult: Secondary | ICD-10-CM | POA: Diagnosis not present

## 2019-03-16 DIAGNOSIS — Z79890 Hormone replacement therapy (postmenopausal): Secondary | ICD-10-CM | POA: Diagnosis not present

## 2019-04-08 ENCOUNTER — Other Ambulatory Visit: Payer: Self-pay

## 2019-04-08 DIAGNOSIS — Z20822 Contact with and (suspected) exposure to covid-19: Secondary | ICD-10-CM

## 2019-04-10 LAB — NOVEL CORONAVIRUS, NAA: SARS-CoV-2, NAA: NOT DETECTED

## 2019-04-11 ENCOUNTER — Telehealth: Payer: Self-pay | Admitting: *Deleted

## 2019-04-11 NOTE — Telephone Encounter (Signed)
Patient notified of negative result- patient tested for screening- possible exposure. Advised to continue safe practices, call PCP for changes and get flu shot.

## 2019-04-25 DIAGNOSIS — Z0000 Encounter for general adult medical examination without abnormal findings: Secondary | ICD-10-CM | POA: Diagnosis not present

## 2019-04-25 DIAGNOSIS — E063 Autoimmune thyroiditis: Secondary | ICD-10-CM | POA: Diagnosis not present

## 2019-04-25 DIAGNOSIS — Z23 Encounter for immunization: Secondary | ICD-10-CM | POA: Diagnosis not present

## 2019-04-25 DIAGNOSIS — Z6828 Body mass index (BMI) 28.0-28.9, adult: Secondary | ICD-10-CM | POA: Diagnosis not present

## 2019-04-25 DIAGNOSIS — E7489 Other specified disorders of carbohydrate metabolism: Secondary | ICD-10-CM | POA: Diagnosis not present

## 2019-04-25 DIAGNOSIS — E663 Overweight: Secondary | ICD-10-CM | POA: Diagnosis not present

## 2019-04-25 DIAGNOSIS — I10 Essential (primary) hypertension: Secondary | ICD-10-CM | POA: Diagnosis not present

## 2019-05-17 DIAGNOSIS — I10 Essential (primary) hypertension: Secondary | ICD-10-CM | POA: Diagnosis not present

## 2019-05-17 DIAGNOSIS — E038 Other specified hypothyroidism: Secondary | ICD-10-CM | POA: Diagnosis not present

## 2019-05-17 DIAGNOSIS — R202 Paresthesia of skin: Secondary | ICD-10-CM | POA: Diagnosis not present

## 2019-05-17 DIAGNOSIS — G43C0 Periodic headache syndromes in child or adult, not intractable: Secondary | ICD-10-CM | POA: Diagnosis not present

## 2019-05-19 DIAGNOSIS — N1831 Chronic kidney disease, stage 3a: Secondary | ICD-10-CM | POA: Diagnosis not present

## 2019-05-19 DIAGNOSIS — E063 Autoimmune thyroiditis: Secondary | ICD-10-CM | POA: Diagnosis not present

## 2019-05-19 DIAGNOSIS — I129 Hypertensive chronic kidney disease with stage 1 through stage 4 chronic kidney disease, or unspecified chronic kidney disease: Secondary | ICD-10-CM | POA: Diagnosis not present

## 2019-05-19 DIAGNOSIS — R319 Hematuria, unspecified: Secondary | ICD-10-CM | POA: Diagnosis not present

## 2019-09-22 ENCOUNTER — Other Ambulatory Visit (HOSPITAL_COMMUNITY): Payer: Self-pay | Admitting: Internal Medicine

## 2019-09-22 DIAGNOSIS — E2839 Other primary ovarian failure: Secondary | ICD-10-CM

## 2019-09-30 ENCOUNTER — Inpatient Hospital Stay (HOSPITAL_COMMUNITY): Admission: RE | Admit: 2019-09-30 | Payer: BC Managed Care – PPO | Source: Ambulatory Visit

## 2019-10-19 ENCOUNTER — Other Ambulatory Visit (HOSPITAL_COMMUNITY): Payer: Self-pay

## 2019-10-27 ENCOUNTER — Ambulatory Visit (HOSPITAL_COMMUNITY)
Admission: RE | Admit: 2019-10-27 | Discharge: 2019-10-27 | Disposition: A | Discharge status: Home / Self Care | Payer: 59 | Source: Ambulatory Visit | Attending: Internal Medicine | Admitting: Internal Medicine

## 2019-10-27 ENCOUNTER — Other Ambulatory Visit: Payer: Self-pay

## 2019-10-27 DIAGNOSIS — E2839 Other primary ovarian failure: Secondary | ICD-10-CM | POA: Diagnosis present

## 2019-11-14 ENCOUNTER — Other Ambulatory Visit (HOSPITAL_COMMUNITY): Payer: Self-pay | Admitting: Internal Medicine

## 2019-11-14 DIAGNOSIS — Z1231 Encounter for screening mammogram for malignant neoplasm of breast: Secondary | ICD-10-CM

## 2019-12-02 ENCOUNTER — Other Ambulatory Visit: Payer: Self-pay

## 2019-12-02 ENCOUNTER — Ambulatory Visit (HOSPITAL_COMMUNITY)
Admission: RE | Admit: 2019-12-02 | Discharge: 2019-12-02 | Disposition: A | Discharge status: Home / Self Care | Payer: 59 | Source: Ambulatory Visit | Attending: Internal Medicine | Admitting: Internal Medicine

## 2019-12-02 DIAGNOSIS — Z1231 Encounter for screening mammogram for malignant neoplasm of breast: Secondary | ICD-10-CM | POA: Diagnosis present

## 2019-12-07 ENCOUNTER — Other Ambulatory Visit (HOSPITAL_COMMUNITY): Payer: Self-pay | Admitting: Internal Medicine

## 2019-12-07 DIAGNOSIS — R928 Other abnormal and inconclusive findings on diagnostic imaging of breast: Secondary | ICD-10-CM

## 2019-12-22 ENCOUNTER — Other Ambulatory Visit (HOSPITAL_COMMUNITY): Payer: Self-pay | Admitting: Internal Medicine

## 2019-12-22 ENCOUNTER — Ambulatory Visit (HOSPITAL_COMMUNITY)
Admission: RE | Admit: 2019-12-22 | Discharge: 2019-12-22 | Disposition: A | Discharge status: Home / Self Care | Payer: 59 | Source: Ambulatory Visit | Attending: Internal Medicine | Admitting: Internal Medicine

## 2019-12-22 ENCOUNTER — Other Ambulatory Visit: Payer: Self-pay

## 2019-12-22 DIAGNOSIS — R928 Other abnormal and inconclusive findings on diagnostic imaging of breast: Secondary | ICD-10-CM | POA: Insufficient documentation

## 2019-12-27 ENCOUNTER — Ambulatory Visit (HOSPITAL_COMMUNITY)
Admission: RE | Admit: 2019-12-27 | Discharge: 2019-12-27 | Disposition: A | Discharge status: Home / Self Care | Payer: 59 | Source: Ambulatory Visit | Attending: Internal Medicine | Admitting: Internal Medicine

## 2019-12-27 ENCOUNTER — Other Ambulatory Visit: Payer: Self-pay

## 2019-12-27 DIAGNOSIS — R928 Other abnormal and inconclusive findings on diagnostic imaging of breast: Secondary | ICD-10-CM | POA: Diagnosis present

## 2019-12-27 MED ORDER — LIDOCAINE HCL (PF) 2 % IJ SOLN
INTRAMUSCULAR | Status: AC
  Filled 2019-12-27: qty 10

## 2019-12-27 MED ORDER — LIDOCAINE-EPINEPHRINE (PF) 1 %-1:200000 IJ SOLN
INTRAMUSCULAR | Status: AC
  Filled 2019-12-27: qty 30

## 2020-01-10 ENCOUNTER — Ambulatory Visit (HOSPITAL_COMMUNITY)
Admission: RE | Admit: 2020-01-10 | Discharge: 2020-01-10 | Disposition: A | Discharge status: Home / Self Care | Payer: 59 | Source: Ambulatory Visit | Attending: Internal Medicine | Admitting: Internal Medicine

## 2020-01-10 ENCOUNTER — Other Ambulatory Visit (HOSPITAL_COMMUNITY): Payer: Self-pay | Admitting: Internal Medicine

## 2020-01-10 ENCOUNTER — Other Ambulatory Visit: Payer: Self-pay

## 2020-01-10 DIAGNOSIS — M5440 Lumbago with sciatica, unspecified side: Secondary | ICD-10-CM

## 2020-01-20 ENCOUNTER — Other Ambulatory Visit (HOSPITAL_COMMUNITY): Payer: Self-pay | Admitting: Internal Medicine

## 2020-01-20 ENCOUNTER — Other Ambulatory Visit: Payer: Self-pay | Admitting: Internal Medicine

## 2020-01-20 DIAGNOSIS — M5410 Radiculopathy, site unspecified: Secondary | ICD-10-CM

## 2020-02-02 ENCOUNTER — Ambulatory Visit (HOSPITAL_COMMUNITY)
Admission: RE | Admit: 2020-02-02 | Discharge: 2020-02-02 | Disposition: A | Discharge status: Home / Self Care | Payer: 59 | Source: Ambulatory Visit | Attending: Internal Medicine | Admitting: Internal Medicine

## 2020-02-02 ENCOUNTER — Other Ambulatory Visit: Payer: Self-pay

## 2020-02-02 DIAGNOSIS — M5410 Radiculopathy, site unspecified: Secondary | ICD-10-CM | POA: Insufficient documentation

## 2020-06-15 ENCOUNTER — Other Ambulatory Visit: Payer: 59

## 2020-06-19 ENCOUNTER — Telehealth: Payer: Self-pay

## 2020-06-19 NOTE — Telephone Encounter (Signed)
Pt called for results of covid test- pt went to a testing place in [place] last Friday. Advised pt that she will need to call that location for the results. Pt verbalized understanding.

## 2020-07-18 ENCOUNTER — Encounter: Payer: Self-pay | Admitting: Internal Medicine

## 2020-10-31 ENCOUNTER — Encounter (HOSPITAL_COMMUNITY): Payer: Self-pay | Admitting: Emergency Medicine

## 2020-10-31 ENCOUNTER — Emergency Department (HOSPITAL_COMMUNITY)
Admission: EM | Admit: 2020-10-31 | Discharge: 2020-10-31 | Disposition: A | Discharge status: Home / Self Care | Payer: 59 | Attending: Emergency Medicine | Admitting: Emergency Medicine

## 2020-10-31 ENCOUNTER — Other Ambulatory Visit: Payer: Self-pay

## 2020-10-31 DIAGNOSIS — I10 Essential (primary) hypertension: Secondary | ICD-10-CM | POA: Insufficient documentation

## 2020-10-31 DIAGNOSIS — T424X4A Poisoning by benzodiazepines, undetermined, initial encounter: Secondary | ICD-10-CM

## 2020-10-31 DIAGNOSIS — F419 Anxiety disorder, unspecified: Secondary | ICD-10-CM | POA: Insufficient documentation

## 2020-10-31 DIAGNOSIS — T424X1A Poisoning by benzodiazepines, accidental (unintentional), initial encounter: Secondary | ICD-10-CM | POA: Diagnosis present

## 2020-10-31 DIAGNOSIS — Y906 Blood alcohol level of 120-199 mg/100 ml: Secondary | ICD-10-CM | POA: Diagnosis not present

## 2020-10-31 DIAGNOSIS — R079 Chest pain, unspecified: Secondary | ICD-10-CM | POA: Diagnosis not present

## 2020-10-31 DIAGNOSIS — Z79899 Other long term (current) drug therapy: Secondary | ICD-10-CM | POA: Insufficient documentation

## 2020-10-31 DIAGNOSIS — R0602 Shortness of breath: Secondary | ICD-10-CM | POA: Insufficient documentation

## 2020-10-31 DIAGNOSIS — F32A Depression, unspecified: Secondary | ICD-10-CM | POA: Diagnosis not present

## 2020-10-31 DIAGNOSIS — T50904A Poisoning by unspecified drugs, medicaments and biological substances, undetermined, initial encounter: Secondary | ICD-10-CM

## 2020-10-31 LAB — CBC WITH DIFFERENTIAL/PLATELET
Abs Immature Granulocytes: 0.02 10*3/uL (ref 0.00–0.07)
Basophils Absolute: 0.1 10*3/uL (ref 0.0–0.1)
Basophils Relative: 1 %
Eosinophils Absolute: 0.2 10*3/uL (ref 0.0–0.5)
Eosinophils Relative: 2 %
HCT: 47.7 % — ABNORMAL HIGH (ref 36.0–46.0)
Hemoglobin: 15.2 g/dL — ABNORMAL HIGH (ref 12.0–15.0)
Immature Granulocytes: 0 %
Lymphocytes Relative: 52 %
Lymphs Abs: 3.3 10*3/uL (ref 0.7–4.0)
MCH: 29.9 pg (ref 26.0–34.0)
MCHC: 31.9 g/dL (ref 30.0–36.0)
MCV: 93.9 fL (ref 80.0–100.0)
Monocytes Absolute: 0.4 10*3/uL (ref 0.1–1.0)
Monocytes Relative: 6 %
Neutro Abs: 2.6 10*3/uL (ref 1.7–7.7)
Neutrophils Relative %: 39 %
Platelets: 361 10*3/uL (ref 150–400)
RBC: 5.08 MIL/uL (ref 3.87–5.11)
RDW: 13.2 % (ref 11.5–15.5)
WBC: 6.6 10*3/uL (ref 4.0–10.5)
nRBC: 0 % (ref 0.0–0.2)

## 2020-10-31 LAB — COMPREHENSIVE METABOLIC PANEL
ALT: 48 U/L — ABNORMAL HIGH (ref 0–44)
AST: 42 U/L — ABNORMAL HIGH (ref 15–41)
Albumin: 4.2 g/dL (ref 3.5–5.0)
Alkaline Phosphatase: 67 U/L (ref 38–126)
Anion gap: 9 (ref 5–15)
BUN: 11 mg/dL (ref 8–23)
CO2: 30 mmol/L (ref 22–32)
Calcium: 9.5 mg/dL (ref 8.9–10.3)
Chloride: 106 mmol/L (ref 98–111)
Creatinine, Ser: 0.79 mg/dL (ref 0.44–1.00)
GFR, Estimated: >60 mL/min (ref >60)
Glucose, Bld: 98 mg/dL (ref 70–99)
Potassium: 3.6 mmol/L (ref 3.5–5.1)
Sodium: 145 mmol/L (ref 135–145)
Total Bilirubin: 0.5 mg/dL (ref 0.3–1.2)
Total Protein: 8.1 g/dL (ref 6.5–8.1)

## 2020-10-31 LAB — TROPONIN I (HIGH SENSITIVITY)
Troponin I (High Sensitivity): 4 ng/L (ref <18)
Troponin I (High Sensitivity): 3 ng/L (ref <18)

## 2020-10-31 LAB — RAPID URINE DRUG SCREEN, HOSP PERFORMED
Amphetamines: NOT DETECTED
Barbiturates: NOT DETECTED
Benzodiazepines: POSITIVE — AB
Cocaine: NOT DETECTED
Opiates: NOT DETECTED
Tetrahydrocannabinol: NOT DETECTED

## 2020-10-31 LAB — ETHANOL: Alcohol, Ethyl (B): 151 mg/dL — ABNORMAL HIGH (ref <10)

## 2020-10-31 MED ORDER — HYDROCHLOROTHIAZIDE 25 MG PO TABS: 25.0000 mg | ORAL_TABLET | Freq: Every day | ORAL | Status: DC

## 2020-10-31 MED ORDER — LEVOTHYROXINE SODIUM 75 MCG PO TABS
75.0000 ug | ORAL_TABLET | Freq: Every day | ORAL | Status: DC
  Filled 2020-10-31 (×3): qty 1

## 2020-10-31 MED ORDER — ESCITALOPRAM OXALATE 10 MG PO TABS: 10.0000 mg | ORAL_TABLET | Freq: Every day | ORAL | Status: DC

## 2020-10-31 MED ORDER — ESTRADIOL 1 MG PO TABS
1.0000 mg | ORAL_TABLET | Freq: Every day | ORAL | Status: DC
  Filled 2020-10-31 (×3): qty 1

## 2020-10-31 MED ORDER — MEDROXYPROGESTERONE ACETATE 5 MG PO TABS
2.5000 mg | ORAL_TABLET | Freq: Every day | ORAL | Status: DC
  Filled 2020-10-31 (×3): qty 1

## 2020-10-31 MED ORDER — OXYCODONE-ACETAMINOPHEN 5-325 MG PO TABS
1.0000 | ORAL_TABLET | Freq: Once | ORAL | Status: AC
Start: 2020-10-31 — End: 2020-10-31
  Administered 2020-10-31: 1 via ORAL
  Filled 2020-10-31: qty 1

## 2020-10-31 NOTE — ED Provider Notes (Signed)
Patient seen by psychiatry team and cleared for continued outpatient care.  Advised immediate return if she has worsening symptoms or any additional concerns.    Luna Fuse, MD 10/31/20 [phone]

## 2020-10-31 NOTE — Discharge Instructions (Addendum)
Call your primary care doctor or specialist as discussed in the next 2-3 days.   Return immediately back to the ER if:  Your symptoms worsen within the next 12-24 hours. You develop new symptoms such as new fevers, persistent vomiting, new pain, shortness of breath, or new weakness or numbness, or if you have any other concerns.  

## 2020-10-31 NOTE — ED Notes (Signed)
Pt in room talking to someone on the phone . Pt is calm but anxious about her discharge plan. Nurse explained to pt she is waiting on TTS.  Nurse explained how TTS worked and stated she was unaware of exact timing for it.

## 2020-10-31 NOTE — ED Notes (Signed)
Pt called out stating that she was ready to leave. Pt was informed that she was placed on a psych hold and that she would not be able to leave until she spoke with the psych person. Pt was visibly upset and stated that she was no suicidal or homicidal and felt safe to go home. Pt requested to speak with provider.   Dr. Betsey Holiday made aware.

## 2020-10-31 NOTE — ED Notes (Signed)
TTS at this [facility]

## 2020-10-31 NOTE — ED Triage Notes (Signed)
Pt states she took 5-6 2mg  valium over several hours. Pt has been dealing with stress from martial problems. Pt denies any si/hi ideations, but states she was just trying to get through the day.

## 2020-10-31 NOTE — ED Provider Notes (Signed)
[facility] EMERGENCY DEPARTMENT Provider Note   CSN: [id number] Arrival date & time: 10/31/20  0230     History Chief Complaint  Patient presents with  . Drug Overdose    Shelly Wood is a 65 y.o. female.  Patient presents to the emergency department for evaluation of overdose.  Patient reportedly has been having problems with her husband.  She found out tonight that he has been unfaithful to her and in response to this has taken multiple doses of Valium and drank multiple beers.  Patient very distraught at arrival.  She denies that this was a suicide attempt.  Patient reportedly developed chest pain and shortness of breath after finding out this bad news as well.        Past Medical History:  Diagnosis Date  . Hypertension   . Migraine   . Thyroid disease   . Vertigo     Patient Active Problem List   Diagnosis Date Noted  . CONSTIPATION 02/22/2010    Past Surgical History:  Procedure Laterality Date  . CHOLECYSTECTOMY       OB History   No obstetric history on file.     No family history on file.  Social History   Tobacco Use  . Smoking status: Never Smoker  . Smokeless tobacco: Never Used  Substance Use Topics  . Alcohol use: No  . Drug use: No    Home Medications Prior to Admission medications   Medication Sig Start Date End Date Taking? Authorizing Provider  ACETAMINOPHEN-BUTALBITAL 50-325 MG TABS Take 1 tablet by mouth 2 (two) times daily as needed. 02/19/10   [provider]  Aspirin-Acetaminophen-Caffeine (EXCEDRIN MIGRAINE PO) Take 1 tablet by mouth as needed (pain, headaches).    [provider]  cholecalciferol (VITAMIN D) 1000 units tablet Take 1,000 Units by mouth daily.    [provider]  diazepam (VALIUM) 10 MG tablet Take 1 tablet by mouth at bedtime as needed for sleep. 10/30/16   [provider]  escitalopram (LEXAPRO) 10 MG tablet Take 10 mg by mouth daily. 11/17/16   [provider]   estradiol (ESTRACE) 1 MG tablet Take 1 tablet by mouth daily. 11/20/16   [provider]  hydrochlorothiazide (HYDRODIURIL) 25 MG tablet Take 25 mg by mouth daily. 11/16/16   [provider]  Levothyroxine Sodium 75 MCG CAPS Take 75-100 mcg by mouth daily. 02/19/10   [provider]  medroxyPROGESTERone (PROVERA) 2.5 MG tablet Take 1 tablet by mouth daily. 11/20/16   [provider]    Allergies    Patient has no known allergies.  Review of Systems   Review of Systems  Respiratory: Positive for shortness of breath.   Cardiovascular: Positive for chest pain.  Psychiatric/Behavioral: Positive for dysphoric mood. Negative for suicidal ideas. The patient is nervous/anxious.   All other systems reviewed and are negative.   Physical Exam Updated Vital Signs BP 104/66 (BP Location: Left Arm)   Pulse 95   Temp 98.4 F (36.9 C) (Oral)   Resp 16   Ht 5\' 7"  (1.702 m)   Wt 68 kg   SpO2 95%   BMI 23.48 kg/m   Physical Exam Vitals and nursing note reviewed.  Constitutional:      General: She is not in acute distress.    Appearance: Normal appearance. She is well-developed.  HENT:     Head: Normocephalic and atraumatic.     Right Ear: Hearing normal.     Left Ear: Hearing  normal.     Nose: Nose normal.  Eyes:     Conjunctiva/sclera: Conjunctivae normal.     Pupils: Pupils are equal, round, and reactive to light.  Cardiovascular:     Rate and Rhythm: Regular rhythm.     Heart sounds: S1 normal and S2 normal. No murmur heard. No friction rub. No gallop.   Pulmonary:     Effort: Pulmonary effort is normal. No respiratory distress.     Breath sounds: Normal breath sounds.  Chest:     Chest wall: No tenderness.  Abdominal:     General: Bowel sounds are normal.     Palpations: Abdomen is soft.     Tenderness: There is no abdominal tenderness. There is no guarding or rebound. Negative signs include Murphy's sign and McBurney's sign.     Hernia: No  hernia is present.  Musculoskeletal:        General: Normal range of motion.     Cervical back: Normal range of motion and neck supple.  Skin:    General: Skin is warm and dry.     Findings: No rash.  Neurological:     Mental Status: She is alert and oriented to person, place, and time.     GCS: GCS eye subscore is 4. GCS verbal subscore is 5. GCS motor subscore is 6.     Cranial Nerves: No cranial nerve deficit.     Sensory: No sensory deficit.     Coordination: Coordination normal.  Psychiatric:        Mood and Affect: Mood is anxious and depressed. Affect is tearful.        Behavior: Behavior is withdrawn.     ED Results / Procedures / Treatments   Labs (all labs ordered are listed, but only abnormal results are displayed) Labs Reviewed  CBC WITH DIFFERENTIAL/PLATELET - Abnormal; Notable for the following components:      Result Value   Hemoglobin 15.2 (*)    HCT 47.7 (*)    All other components within normal limits  COMPREHENSIVE METABOLIC PANEL - Abnormal; Notable for the following components:   AST 42 (*)    ALT 48 (*)    All other components within normal limits  ETHANOL - Abnormal; Notable for the following components:   Alcohol, Ethyl (B) 151 (*)    All other components within normal limits  RAPID URINE DRUG SCREEN, HOSP PERFORMED - Abnormal; Notable for the following components:   Benzodiazepines POSITIVE (*)    All other components within normal limits  TROPONIN I (HIGH SENSITIVITY)  TROPONIN I (HIGH SENSITIVITY)    EKG EKG Interpretation  Date/Time:  Wednesday Oct 31 2020 02:46:48 EDT Ventricular Rate:  73 PR Interval:  211 QRS Duration: 85 QT Interval:  387 QTC Calculation: 427 R Axis:   88 Text Interpretation: Sinus rhythm Borderline right axis deviation Confirmed by Orpah Greek [phone]) on 10/31/2020 3:47:15 AM   Radiology No results found.  Procedures Procedures   Medications Ordered in ED Medications - No data to display  ED  Course  I have reviewed the triage vital signs and the nursing notes.  Pertinent labs & imaging results that were available during my care of the patient were reviewed by me and considered in my medical decision making (see chart for details).    MDM Rules/Calculators/A&P                          Patient presents to  the emergency department for evaluation of possible overdose.  Patient has taken up to 5 Valium tablets and drank beer tonight.  This was in response to marital discourse.  Patient reports that she has not suicidal.  She reports that she was just "trying to get through the day".  Work-up is unremarkable.  Will require psychiatric evaluation.  Medically clear for psychiatric treatment.  Final Clinical Impression(s) / ED Diagnoses Final diagnoses:  Benzodiazepine overdose of undetermined intent, initial encounter    Rx / DC Orders ED Discharge Orders    None       Ainara Eldridge, Gwenyth Allegra, MD 10/31/20 0502

## 2020-10-31 NOTE — ED Notes (Signed)
Pt ambulated to the bathroom.  
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
## 2020-10-31 NOTE — BH Assessment (Signed)
DISPOSITION: Gave clinical report to Thomes Lolling, NP  who determined Pt does not meet criteria for inpatient psychiatric treatment. Notified Dr. Thamas Jaegers, MD and Lyn Henri , RN of disposition recommendation and the sitter utilization recommendation.

## 2021-01-11 IMAGING — MR MR LUMBAR SPINE W/O CM
4 of 5 series · 32 of 48 positions shown · non-contrast
Comparison: Lumbar radiographs 01/10/2020. CT Abdomen and Pelvis
10/19/2012.

CLINICAL DATA: 63-year-old female with low back pain radiating down
the left leg for 2 months. No known injury.

EXAM:
MRI LUMBAR SPINE WITHOUT CONTRAST
TECHNIQUE: Multiplanar, multisequence MR imaging of the lumbar spine was
performed. No intravenous contrast was administered.

[Series 11: T1 · sagittal · 4.0mm · 0.81mm/px · 7 of 15 slices shown (1 of 2)]
[im 1/15]
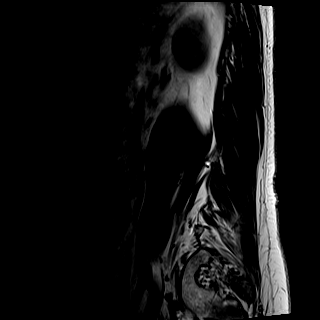
[im 3/15]
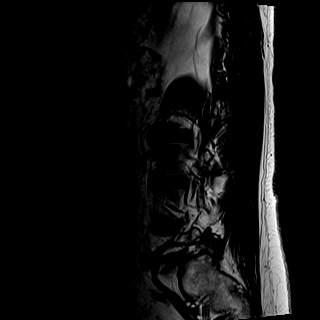
[im 5/15]
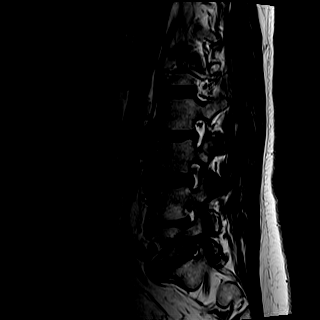
[im 8/15]
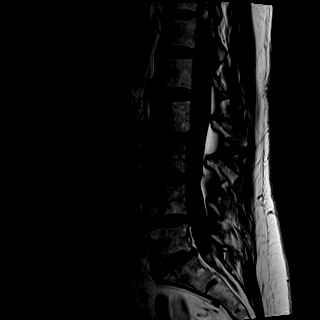
[im 10/15]
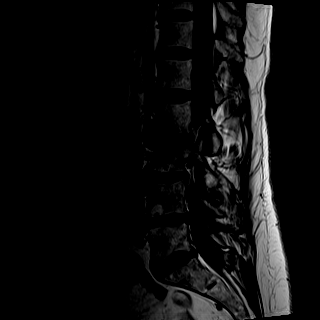
[im 12/15]
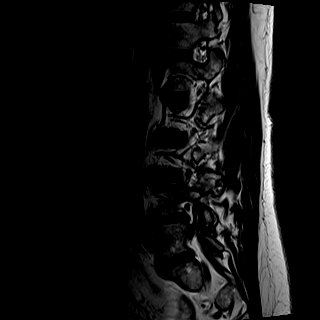
[im 15/15]
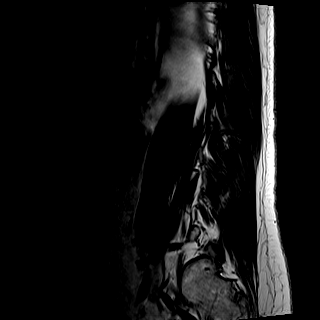

[Series 12: T2 · sagittal · 4.0mm · 0.68mm/px · 8 of 17 slices shown (1 of 2)]
[im 1/17]
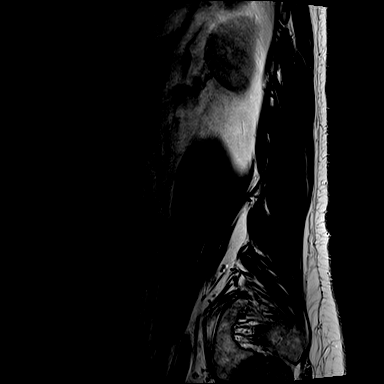
[im 3/17]
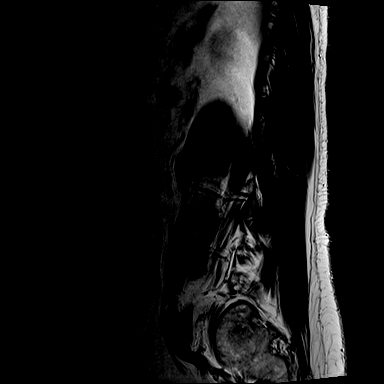
[im 5/17]
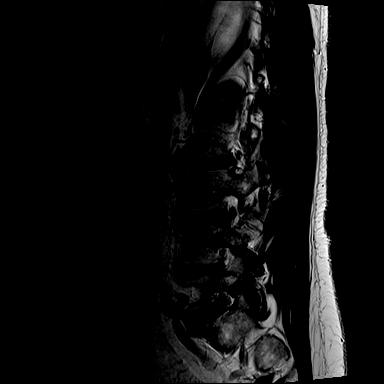
[im 7/17]
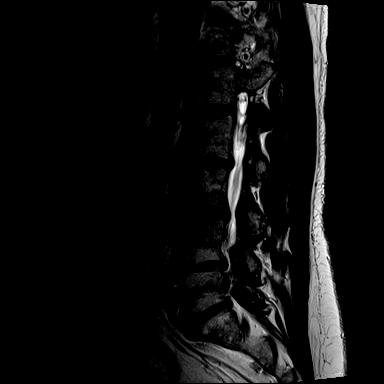
[im 10/17]
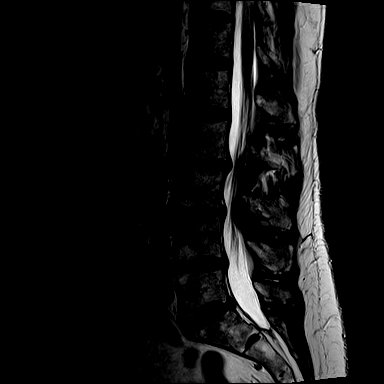
[im 12/17]
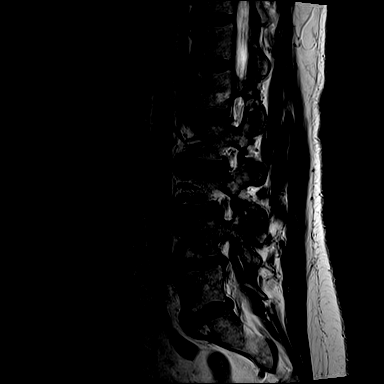
[im 14/17]
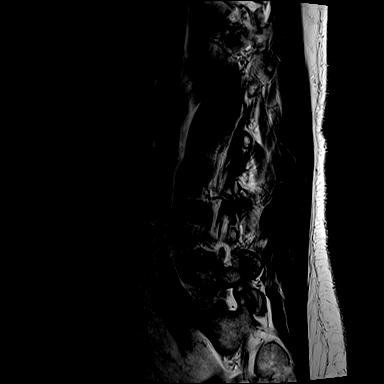
[im 17/17]
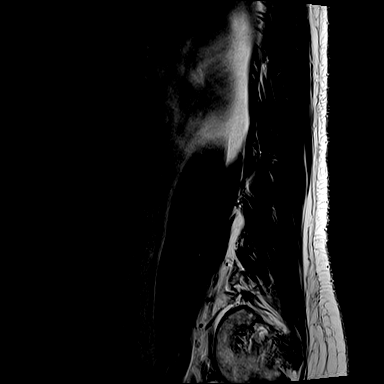

[Series 13: T2 · axial · 4.0mm · 0.70mm/px · z∈[-130,+45]mm · 9 of 30 slices shown (2 of 2)]
[im 1/30]
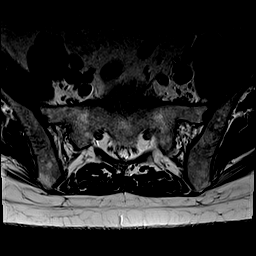
[im 5/30]
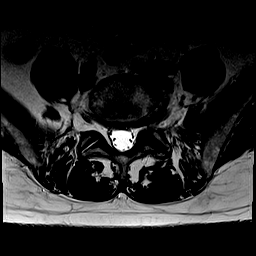
[im 10/30]
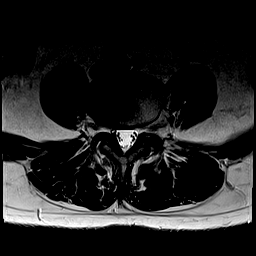
[im 13/30]
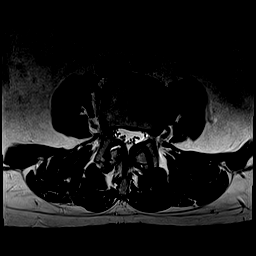
[im 15/30]
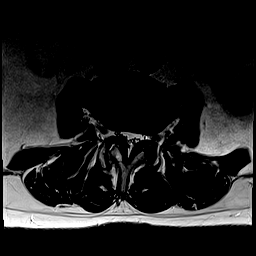
[im 17/30]
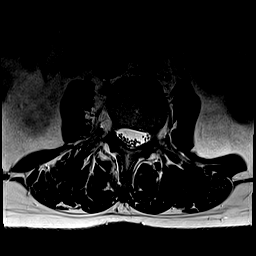
[im 20/30]
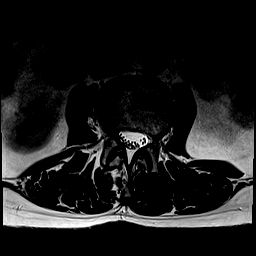
[im 25/30]
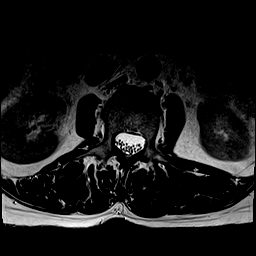
[im 30/30]
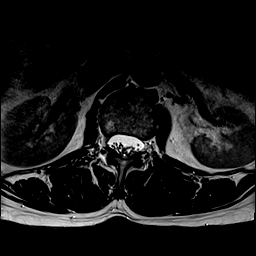

[Series 14: T1 · axial · 4.0mm · 0.35mm/px · z∈[-130,+20]mm · 8 of 30 slices shown (2 of 2)]
[im 1/30]
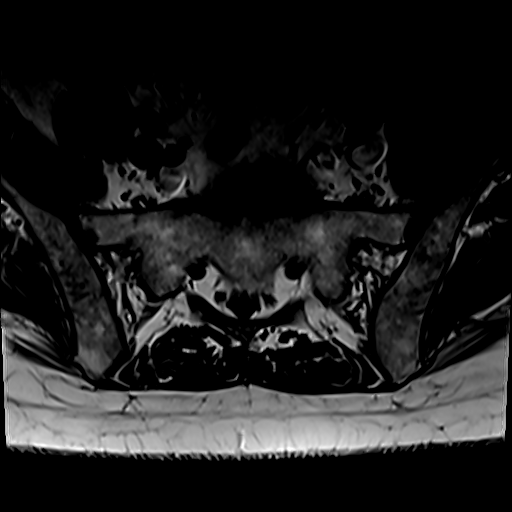
[im 5/30]
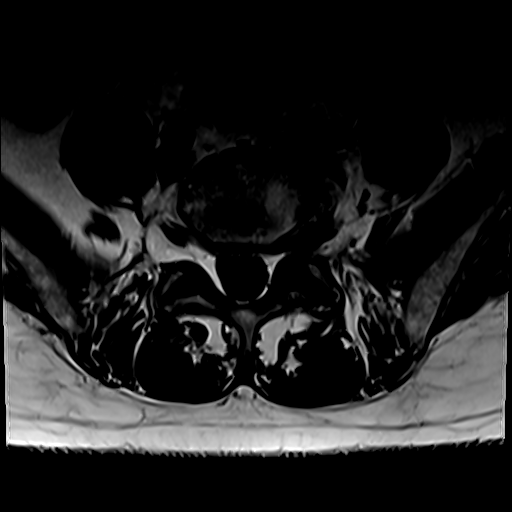
[im 10/30]
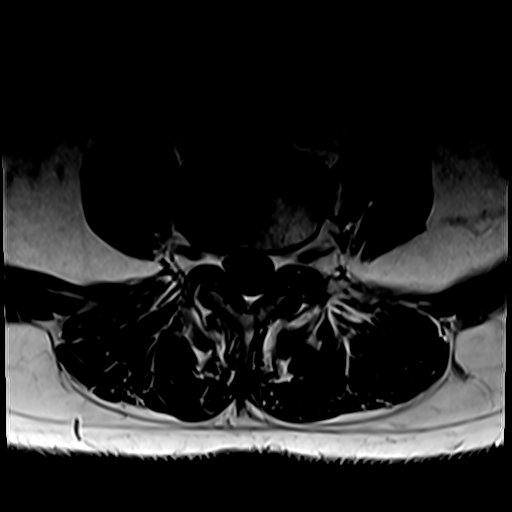
[im 13/30]
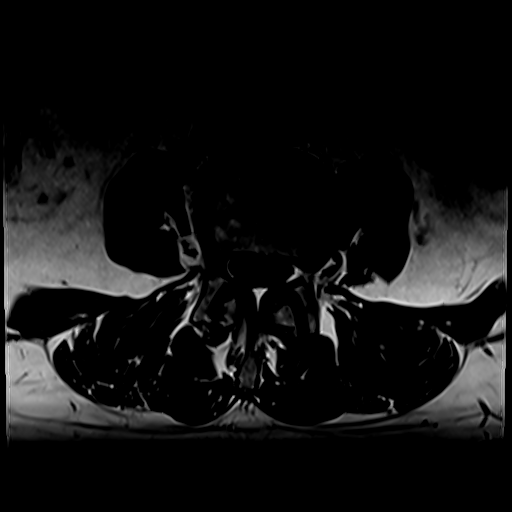
[im 15/30]
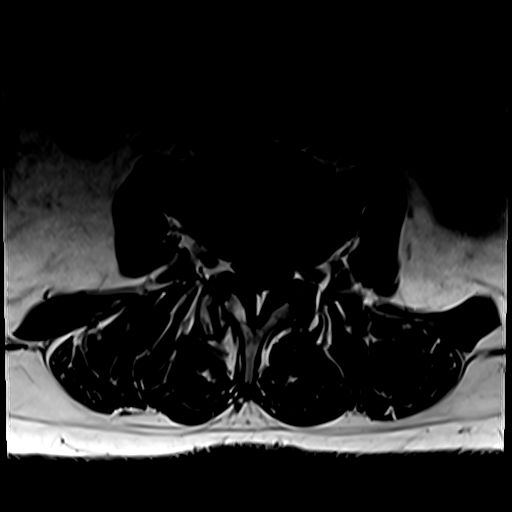
[im 17/30]
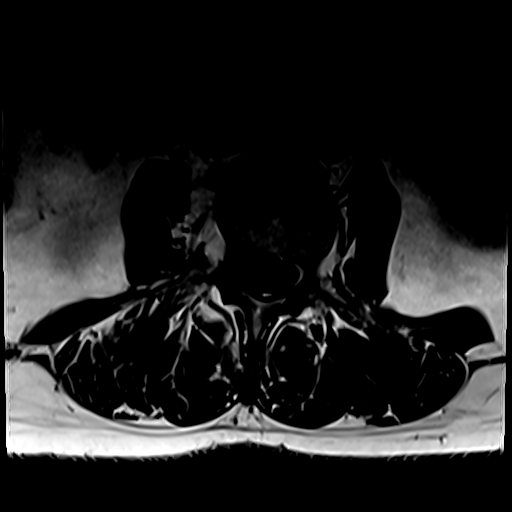
[im 20/30]
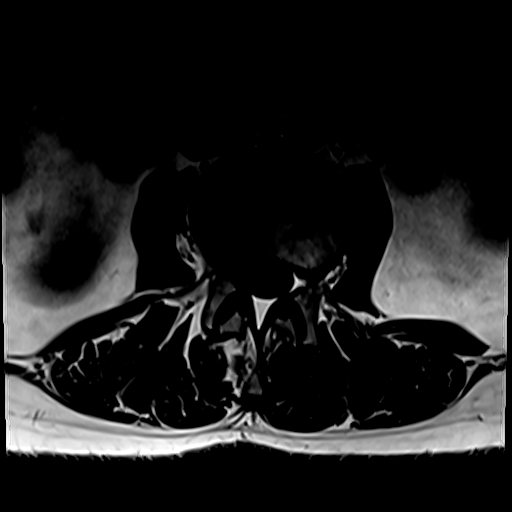
[im 25/30]
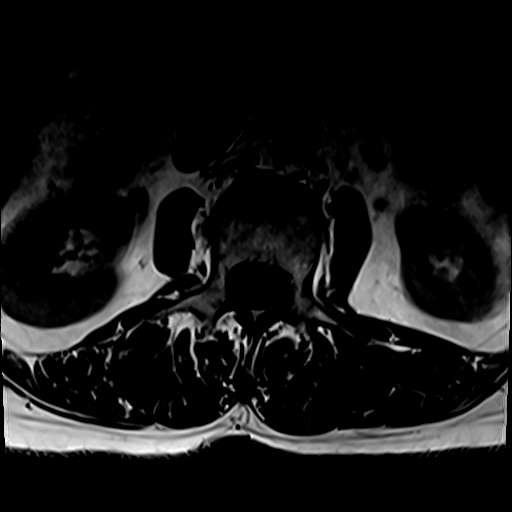

[32 of 48 positions shown; findings below may reference images not displayed]

FINDINGS: Segmentation:  Normal on the comparison radiographs.

Alignment: Stable straightening of lumbar lordosis. Subtle
retrolisthesis of L2 on L3. Mild levoconvex lumbar scoliosis as seen
radiographically.

Vertebrae: Mild degenerative appearing endplate marrow edema at
L2-L3 eccentric to the right (series 10, image 7). Normal background
bone marrow signal. No other acute osseous abnormality identified.
Intact visible sacrum and SI joints.

Conus medullaris and cauda equina: Conus extends to the T12-L1
level. No lower spinal cord or conus signal abnormality.

Paraspinal and other soft tissues: Negative.

Disc levels:

T11-T12 through L1-L2: Negative.

L2-L3: Disc desiccation and disc space loss. Circumferential disc
bulge and endplate spurring, with foraminal involvement by disc on
both sides. Left foraminal annular fissure. Mild superimposed facet
hypertrophy. No significant spinal stenosis. Mild right lateral
recess stenosis (right L3 nerve level). Mild left greater than right
L2 foraminal stenosis.

L3-L4: Disc desiccation. Circumferential disc bulge eccentric to the
left. Mild to moderate facet and ligament flavum hypertrophy with
trace facet joint fluid. Mild spinal stenosis. No significant
lateral recess stenosis. Mild bilateral L3 foraminal stenosis
greater on the left.

L4-L5: Circumferential disc bulge and mild endplate spurring. Mild
to moderate posterior element hypertrophy. No spinal stenosis.
Borderline to mild right lateral recess stenosis, and left L4
foraminal stenosis.

L5-S1: Disc desiccation with circumferential disc bulge and
superimposed left far lateral disc osteophyte complex. Mild to
moderate facet hypertrophy greater on the left. No spinal or lateral
recess stenosis. Moderate to severe left L5 foraminal stenosis
(series 12, image 12).
IMPRESSION: 1. Multilevel chronic lumbar disc and endplate degeneration. Mild
scoliosis and degenerative endplate marrow edema at L2-L3 eccentric
to the right.
2. The symptomatic level might be L5-S1, where there is moderate to
severe multifactorial left foraminal stenosis. Query Left L5
radiculitis.
3. Otherwise there is mild left foraminal stenosis L2-L3 through
L4-L5. And mild multifactorial spinal stenosis at L3-L4.

## 2021-02-07 ENCOUNTER — Other Ambulatory Visit (HOSPITAL_COMMUNITY): Payer: Self-pay | Admitting: Internal Medicine

## 2021-02-07 DIAGNOSIS — M79641 Pain in right hand: Secondary | ICD-10-CM

## 2021-02-07 DIAGNOSIS — E063 Autoimmune thyroiditis: Secondary | ICD-10-CM | POA: Diagnosis not present

## 2021-02-07 DIAGNOSIS — I10 Essential (primary) hypertension: Secondary | ICD-10-CM | POA: Diagnosis not present

## 2021-02-07 DIAGNOSIS — M1991 Primary osteoarthritis, unspecified site: Secondary | ICD-10-CM | POA: Diagnosis not present

## 2021-02-08 ENCOUNTER — Other Ambulatory Visit: Payer: Self-pay

## 2021-02-08 ENCOUNTER — Ambulatory Visit (HOSPITAL_COMMUNITY)
Admission: RE | Admit: 2021-02-08 | Discharge: 2021-02-08 | Disposition: A | Discharge status: Home / Self Care | Payer: Medicare Other | Source: Ambulatory Visit | Attending: Internal Medicine | Admitting: Internal Medicine

## 2021-02-08 DIAGNOSIS — M7989 Other specified soft tissue disorders: Secondary | ICD-10-CM | POA: Diagnosis not present

## 2021-02-08 DIAGNOSIS — M79641 Pain in right hand: Secondary | ICD-10-CM | POA: Diagnosis not present

## 2021-02-08 DIAGNOSIS — M19031 Primary osteoarthritis, right wrist: Secondary | ICD-10-CM | POA: Diagnosis not present

## 2021-02-08 DIAGNOSIS — M19041 Primary osteoarthritis, right hand: Secondary | ICD-10-CM | POA: Diagnosis not present

## 2021-03-07 ENCOUNTER — Other Ambulatory Visit (HOSPITAL_COMMUNITY): Payer: Self-pay | Admitting: Internal Medicine

## 2021-03-07 DIAGNOSIS — Z1231 Encounter for screening mammogram for malignant neoplasm of breast: Secondary | ICD-10-CM

## 2021-03-11 DIAGNOSIS — M65331 Trigger finger, right middle finger: Secondary | ICD-10-CM | POA: Diagnosis not present

## 2021-03-11 DIAGNOSIS — S63652A Sprain of metacarpophalangeal joint of right middle finger, initial encounter: Secondary | ICD-10-CM | POA: Diagnosis not present

## 2021-03-13 DIAGNOSIS — S63652A Sprain of metacarpophalangeal joint of right middle finger, initial encounter: Secondary | ICD-10-CM | POA: Diagnosis not present

## 2021-03-14 ENCOUNTER — Other Ambulatory Visit: Payer: Self-pay

## 2021-03-14 ENCOUNTER — Ambulatory Visit (HOSPITAL_COMMUNITY)
Admission: RE | Admit: 2021-03-14 | Discharge: 2021-03-14 | Disposition: A | Discharge status: Home / Self Care | Payer: Medicare Other | Source: Ambulatory Visit | Attending: Internal Medicine | Admitting: Internal Medicine

## 2021-03-14 DIAGNOSIS — Z1231 Encounter for screening mammogram for malignant neoplasm of breast: Secondary | ICD-10-CM | POA: Insufficient documentation

## 2021-03-20 DIAGNOSIS — Z23 Encounter for immunization: Secondary | ICD-10-CM | POA: Diagnosis not present

## 2021-05-07 DIAGNOSIS — I10 Essential (primary) hypertension: Secondary | ICD-10-CM | POA: Diagnosis not present

## 2021-05-07 DIAGNOSIS — E063 Autoimmune thyroiditis: Secondary | ICD-10-CM | POA: Diagnosis not present

## 2021-05-21 DIAGNOSIS — H2513 Age-related nuclear cataract, bilateral: Secondary | ICD-10-CM | POA: Diagnosis not present

## 2021-07-15 ENCOUNTER — Ambulatory Visit: Payer: Medicare Other | Admitting: Dermatology

## 2021-07-15 ENCOUNTER — Other Ambulatory Visit: Payer: Self-pay

## 2021-07-15 DIAGNOSIS — L821 Other seborrheic keratosis: Secondary | ICD-10-CM

## 2021-07-15 DIAGNOSIS — D1800 Hemangioma unspecified site: Secondary | ICD-10-CM

## 2021-07-15 DIAGNOSIS — Z85828 Personal history of other malignant neoplasm of skin: Secondary | ICD-10-CM

## 2021-07-15 DIAGNOSIS — L814 Other melanin hyperpigmentation: Secondary | ICD-10-CM | POA: Diagnosis not present

## 2021-07-15 DIAGNOSIS — L570 Actinic keratosis: Secondary | ICD-10-CM

## 2021-07-15 DIAGNOSIS — D229 Melanocytic nevi, unspecified: Secondary | ICD-10-CM

## 2021-07-15 DIAGNOSIS — L820 Inflamed seborrheic keratosis: Secondary | ICD-10-CM | POA: Diagnosis not present

## 2021-07-15 DIAGNOSIS — Z1283 Encounter for screening for malignant neoplasm of skin: Secondary | ICD-10-CM | POA: Diagnosis not present

## 2021-07-15 DIAGNOSIS — R232 Flushing: Secondary | ICD-10-CM | POA: Diagnosis not present

## 2021-07-15 DIAGNOSIS — L578 Other skin changes due to chronic exposure to nonionizing radiation: Secondary | ICD-10-CM

## 2021-07-15 NOTE — Progress Notes (Signed)
New Patient Visit  Subjective  Shelly Wood is a 66 y.o. female who presents for the following: Annual Exam (History of skin cancer or right wrist treated years ago by Dr. [place] Crane in [place]) and Other (Scaly spot above right brow). The patient presents for Total-Body Skin Exam (TBSE) for skin cancer screening and mole check.  The patient has spots, moles and lesions to be evaluated, some may be new or changing and the patient has concerns that these could be cancer.  The following portions of the chart were reviewed this encounter and updated as appropriate:   Tobacco   Allergies   Meds   Problems   Med Hx   Surg Hx   Fam Hx      Review of Systems:  No other skin or systemic complaints except as noted in HPI or Assessment and Plan.  Objective  Well appearing patient in no apparent distress; mood and affect are within normal limits.  A full examination was performed including scalp, head, eyes, ears, nose, lips, neck, chest, axillae, abdomen, back, buttocks, bilateral upper extremities, bilateral lower extremities, hands, feet, fingers, toes, fingernails, and toenails. All findings within normal limits unless otherwise noted below.  Right distal dorsum forearm x 1, above right lat brow x 1 (2) Erythematous stuck-on, waxy papule or plaque  Above right mid brow Erythematous thin papules/macules with gritty scale.    Assessment & Plan   Lentigines - Scattered tan macules - Due to sun exposure - Benign-appearing, observe - Recommend daily broad spectrum sunscreen SPF 30+ to sun-exposed areas, reapply every 2 hours as needed. - Call for any changes  Seborrheic Keratoses - Stuck-on, waxy, tan-brown papules and/or plaques  - Benign-appearing - Discussed benign etiology and prognosis. - Observe - Call for any changes  Melanocytic Nevi - Tan-brown and/or pink-flesh-colored symmetric macules and papules - Benign appearing on exam today - Observation - Call clinic for new or  changing moles - Recommend daily use of broad spectrum spf 30+ sunscreen to sun-exposed areas.   Hemangiomas - Red papules - Discussed benign nature - Observe - Call for any changes  Actinic Damage - Chronic condition, secondary to cumulative UV/sun exposure - diffuse scaly erythematous macules with underlying dyspigmentation - Recommend daily broad spectrum sunscreen SPF 30+ to sun-exposed areas, reapply every 2 hours as needed.  - Staying in the shade or wearing long sleeves, sun glasses (UVA+UVB protection) and wide brim hats (4-inch brim around the entire circumference of the hat) are also recommended for sun protection.  - Call for new or changing lesions.  Skin cancer screening performed today.  Inflamed seborrheic keratosis (2) Right distal dorsum forearm x 1, above right lat brow x 1  Destruction of lesion - Right distal dorsum forearm x 1, above right lat brow x 1 Complexity: simple   Destruction method: cryotherapy   Informed consent: discussed and consent obtained   Timeout:  patient name, date of birth, surgical site, and procedure verified Lesion destroyed using liquid nitrogen: Yes   Region frozen until ice ball extended beyond lesion: Yes   Outcome: patient tolerated procedure well with no complications   Post-procedure details: wound care instructions given    Flushing Lower legs/feet Benign.  AK (actinic keratosis) Above right mid brow  Destruction of lesion - Above right mid brow Complexity: simple   Destruction method: cryotherapy   Informed consent: discussed and consent obtained   Timeout:  patient name, date of birth, surgical site, and procedure verified Lesion  destroyed using liquid nitrogen: Yes   Region frozen until ice ball extended beyond lesion: Yes   Outcome: patient tolerated procedure well with no complications   Post-procedure details: wound care instructions given    Skin cancer screening  Return in about 3 months (around 10/12/2021)  for AK follow up, ISK follow up.  I, Ashok Cordia, CMA, am acting as scribe for Sarina Ser, MD . Documentation: I have reviewed the above documentation for accuracy and completeness, and I agree with the above.  Sarina Ser, MD

## 2021-07-15 NOTE — Patient Instructions (Signed)

## 2021-07-16 ENCOUNTER — Encounter: Payer: Self-pay | Admitting: Dermatology

## 2021-07-29 ENCOUNTER — Other Ambulatory Visit: Payer: Self-pay

## 2021-07-29 ENCOUNTER — Other Ambulatory Visit (HOSPITAL_COMMUNITY): Payer: Self-pay | Admitting: Internal Medicine

## 2021-07-29 ENCOUNTER — Ambulatory Visit (HOSPITAL_COMMUNITY)
Admission: RE | Admit: 2021-07-29 | Discharge: 2021-07-29 | Disposition: A | Discharge status: Home / Self Care | Payer: Medicare Other | Source: Ambulatory Visit | Attending: Internal Medicine | Admitting: Internal Medicine

## 2021-07-29 DIAGNOSIS — I10 Essential (primary) hypertension: Secondary | ICD-10-CM | POA: Diagnosis not present

## 2021-07-29 DIAGNOSIS — S0990XA Unspecified injury of head, initial encounter: Secondary | ICD-10-CM | POA: Diagnosis not present

## 2021-07-29 DIAGNOSIS — E063 Autoimmune thyroiditis: Secondary | ICD-10-CM | POA: Diagnosis not present

## 2021-07-29 DIAGNOSIS — M1991 Primary osteoarthritis, unspecified site: Secondary | ICD-10-CM | POA: Diagnosis not present

## 2021-07-29 DIAGNOSIS — S0993XA Unspecified injury of face, initial encounter: Secondary | ICD-10-CM

## 2021-07-29 DIAGNOSIS — T148XXA Other injury of unspecified body region, initial encounter: Secondary | ICD-10-CM | POA: Diagnosis not present

## 2021-07-29 DIAGNOSIS — S0083XA Contusion of other part of head, initial encounter: Secondary | ICD-10-CM | POA: Diagnosis not present

## 2021-07-30 DIAGNOSIS — S00201A Unspecified superficial injury of right eyelid and periocular area, initial encounter: Secondary | ICD-10-CM | POA: Diagnosis not present

## 2021-07-30 DIAGNOSIS — S0011XA Contusion of right eyelid and periocular area, initial encounter: Secondary | ICD-10-CM | POA: Diagnosis not present

## 2021-07-30 DIAGNOSIS — H35363 Drusen (degenerative) of macula, bilateral: Secondary | ICD-10-CM | POA: Diagnosis not present

## 2021-08-25 DIAGNOSIS — R509 Fever, unspecified: Secondary | ICD-10-CM | POA: Diagnosis not present

## 2021-08-25 DIAGNOSIS — R059 Cough, unspecified: Secondary | ICD-10-CM | POA: Diagnosis not present

## 2021-08-25 DIAGNOSIS — J029 Acute pharyngitis, unspecified: Secondary | ICD-10-CM | POA: Diagnosis not present

## 2021-08-25 DIAGNOSIS — Z03818 Encounter for observation for suspected exposure to other biological agents ruled out: Secondary | ICD-10-CM | POA: Diagnosis not present

## 2021-09-25 DIAGNOSIS — L508 Other urticaria: Secondary | ICD-10-CM | POA: Diagnosis not present

## 2021-09-25 DIAGNOSIS — R1310 Dysphagia, unspecified: Secondary | ICD-10-CM | POA: Diagnosis not present

## 2021-09-25 DIAGNOSIS — L299 Pruritus, unspecified: Secondary | ICD-10-CM | POA: Diagnosis not present

## 2021-10-29 DIAGNOSIS — H35341 Macular cyst, hole, or pseudohole, right eye: Secondary | ICD-10-CM | POA: Diagnosis not present

## 2021-10-31 ENCOUNTER — Ambulatory Visit: Payer: Medicare Other | Admitting: Dermatology

## 2021-10-31 DIAGNOSIS — L820 Inflamed seborrheic keratosis: Secondary | ICD-10-CM

## 2021-10-31 DIAGNOSIS — Z9181 History of falling: Secondary | ICD-10-CM

## 2021-10-31 DIAGNOSIS — T148XXA Other injury of unspecified body region, initial encounter: Secondary | ICD-10-CM

## 2021-10-31 DIAGNOSIS — R232 Flushing: Secondary | ICD-10-CM | POA: Diagnosis not present

## 2021-10-31 DIAGNOSIS — S0450XA Injury of facial nerve, unspecified side, initial encounter: Secondary | ICD-10-CM | POA: Diagnosis not present

## 2021-10-31 NOTE — Patient Instructions (Addendum)
Cryotherapy Aftercare  Wash gently with soap and water everyday.   Apply Vaseline and Band-Aid daily until healed.   Flushing Recommend starting Allegra or Claritin once daily or may take episodically (when gets flares)   If You Need Anything After Your Visit  If you have any questions or concerns for your doctor, please call our main line at [phone] and press option 4 to reach your doctor's medical assistant. If no one answers, please leave a voicemail as directed and we will return your call as soon as possible. Messages left after 4 pm will be answered the following business day.   You may also send [place] a message via [place]. We typically respond to MyChart messages within 1-2 business days.  For prescription refills, please ask your pharmacy to contact our office. Our fax number is [phone].  If you have an urgent issue when the clinic is closed that cannot wait until the next business day, you can page your doctor at the number below.    Please note that while we do our best to be available for urgent issues outside of office hours, we are not available 24/7.   If you have an urgent issue and are unable to reach [place], you may choose to seek medical care at your doctor's office, retail clinic, urgent care center, or emergency room.  If you have a medical emergency, please immediately call 911 or go to the emergency department.  Pager Numbers  - Dr. Nehemiah Massed: [phone]  - Dr. Laurence Ferrari: [phone]  - Dr. Nicole Kindred: [phone]  In the event of inclement weather, please call our main line at [phone] for an update on the status of any delays or closures.  Dermatology Medication Tips: Please keep the boxes that topical medications come in in order to help keep track of the instructions about where and how to use these. Pharmacies typically print the medication instructions only on the boxes and not directly on the medication tubes.   If your medication is too expensive,  please contact our office at [phone] option 4 or send [place] a message through [place].   We are unable to tell what your co-pay for medications will be in advance as this is different depending on your insurance coverage. However, we may be able to find a substitute medication at lower cost or fill out paperwork to get insurance to cover a needed medication.   If a prior authorization is required to get your medication covered by your insurance company, please allow [place] 1-2 business days to complete this process.  Drug prices often vary depending on where the prescription is filled and some pharmacies may offer cheaper prices.  The website www.goodrx.com contains coupons for medications through different pharmacies. The prices here do not account for what the cost may be with help from insurance (it may be cheaper with your insurance), but the website can give you the price if you did not use any insurance.  - You can print the associated coupon and take it with your prescription to the pharmacy.  - You may also stop by our office during regular business hours and pick up a GoodRx coupon card.  - If you need your prescription sent electronically to a different pharmacy, notify our office through [facility] or by phone at [phone] option 4.     Si Usted Necesita Algo Despus de Su Visita  Tambin puede enviarnos un mensaje a travs de Pharmacist, community. Por lo general respondemos a los mensajes de  MyChart en el transcurso de 1 a 2 das hbiles.  Para renovar recetas, por favor pida a su farmacia que se ponga en contacto con nuestra oficina. Harland Dingwall de fax es [place] [phone].  Si tiene un asunto urgente cuando la clnica est cerrada y que no puede esperar hasta el siguiente da hbil, puede llamar/localizar a su doctor(a) al nmero que aparece a continuacin.   Por favor, tenga en cuenta que aunque hacemos todo lo posible para estar disponibles para asuntos urgentes fuera del  horario de [place], no estamos disponibles las 24 horas del da, los 7 das de la [place].   Si tiene un problema urgente y no puede comunicarse con nosotros, puede optar por buscar atencin mdica  en el consultorio de su doctor(a), en una clnica privada, en un centro de atencin urgente o en una sala de emergencias.  Si tiene Engineering geologist, por favor llame inmediatamente al 911 o vaya a la sala de emergencias.  Nmeros de bper  - Dr. Nehemiah Massed: [phone]  - Dra. Moye: [phone]  - Dra. Nicole Kindred: [phone]  En caso de inclemencias del [place], por favor llame a Johnsie Kindred principal al [phone] para una actualizacin sobre el [place] de cualquier retraso o cierre.  Consejos para la medicacin en dermatologa: Por favor, guarde las cajas en las que vienen los medicamentos de uso tpico para ayudarle a seguir las instrucciones sobre dnde y cmo usarlos. Las farmacias generalmente imprimen las instrucciones del medicamento slo en las cajas y no directamente en los tubos del [place].   Si su medicamento es muy caro, por favor, pngase en contacto con Zigmund Daniel llamando al [phone] y presione la opcin 4 o envenos un mensaje a travs de Pharmacist, community.   No podemos decirle cul ser su copago por los medicamentos por adelantado ya que esto es diferente dependiendo de la cobertura de su seguro. Sin embargo, es posible que podamos encontrar un medicamento sustituto a Electrical engineer un formulario para que el seguro cubra el medicamento que se considera necesario.   Si se requiere una autorizacin previa para que su compaa de seguros [place] su medicamento, por favor permtanos de 1 a 2 das hbiles para completar este proceso.  Los precios de los medicamentos varan con frecuencia dependiendo del Environmental consultant de dnde se surte la receta y alguna farmacias pueden ofrecer precios ms baratos.  El sitio web www.goodrx.com tiene cupones para medicamentos de Office manager. Los precios aqu no tienen en cuenta lo que podra costar con la ayuda del seguro (puede ser ms barato con su seguro), pero el sitio web puede darle el precio si no utiliz Research scientist (physical sciences).  - Puede imprimir el cupn correspondiente y llevarlo con su receta a la farmacia.  - Tambin puede pasar por nuestra oficina durante el horario de atencin regular y Charity fundraiser una tarjeta de cupones de GoodRx.  - Si necesita que su receta se enve electrnicamente a una farmacia diferente, informe a nuestra oficina a travs de MyChart de [place] o por telfono llamando al [phone] y presione la opcin 4.

## 2021-10-31 NOTE — Progress Notes (Signed)
   Follow-Up Visit   Subjective  Shelly Wood is a 66 y.o. female who presents for the following: Actinic Keratosis (Above R mid brow, 49mf/u), ISk f/u (R distal dorsum forearm, above r lat brow, 35m/u), check knot (R glabella/forehead, 70m21mo pt had a fall and now has a knot, and feels numb), and hx of redness and pruritis (Comes on various parts of body, starts out red then starts itching, pt saw PCP and they gave her a steroid IM injection and oral steroid taper but only took ~ 3 days of the oral steroid).  The following portions of the chart were reviewed this encounter and updated as appropriate:   Tobacco  Allergies  Meds  Problems  Med Hx  Surg Hx  Fam Hx     Review of Systems:  No other skin or systemic complaints except as noted in HPI or Assessment and Plan.  Objective  Well appearing patient in no apparent distress; mood and affect are within normal limits.  A focused examination was performed including face, R arm. Relevant physical exam findings are noted in the Assessment and Plan.  Above R lat brow x 1 Residual stuck on waxy pap with erythema  forehead Forehead clear  body Clear today   Assessment & Plan  Inflamed seborrheic keratosis Above R lat brow x 1 Symptomatic, irritating, patient would like treated. Destruction of lesion - Above R lat brow x 1 Complexity: simple   Destruction method: cryotherapy   Informed consent: discussed and consent obtained   Timeout:  patient name, date of birth, surgical site, and procedure verified Lesion destroyed using liquid nitrogen: Yes   Region frozen until ice ball extended beyond lesion: Yes   Outcome: patient tolerated procedure well with no complications   Post-procedure details: wound care instructions given    Nerve damage 2ndary to traumatic fall, hit forehead forehead Hx of fall ~3 months ago probably causing some nerve damage in forehead.  Discussed may/may not resolve.  Only time will tell. Not much  can do.  Consider Neurology evaluation?  Flushing body Hx of Flushing, clear today Discussed flushing with pt Recommend starting Allegra '180mg'$  or Claritin 1 po qd, or may take episodically, Discussed Xolair    Return in about 1 year (around 11/01/2022) for TBSE, Hx of AKs.  I, SonOthelia PullingMA, am acting as scribe for DavSarina SerD . Documentation: I have reviewed the above documentation for accuracy and completeness, and I agree with the above.  DavSarina SerD

## 2021-11-04 ENCOUNTER — Encounter: Payer: Self-pay | Admitting: Dermatology

## 2021-12-03 ENCOUNTER — Encounter: Payer: Self-pay | Admitting: Family Medicine

## 2021-12-30 DIAGNOSIS — Z0001 Encounter for general adult medical examination with abnormal findings: Secondary | ICD-10-CM | POA: Diagnosis not present

## 2021-12-30 DIAGNOSIS — E039 Hypothyroidism, unspecified: Secondary | ICD-10-CM | POA: Diagnosis not present

## 2021-12-30 DIAGNOSIS — I10 Essential (primary) hypertension: Secondary | ICD-10-CM | POA: Diagnosis not present

## 2021-12-30 DIAGNOSIS — I700 Atherosclerosis of aorta: Secondary | ICD-10-CM | POA: Diagnosis not present

## 2021-12-30 DIAGNOSIS — M1991 Primary osteoarthritis, unspecified site: Secondary | ICD-10-CM | POA: Diagnosis not present

## 2022-01-01 DIAGNOSIS — E039 Hypothyroidism, unspecified: Secondary | ICD-10-CM | POA: Diagnosis not present

## 2022-01-01 DIAGNOSIS — E559 Vitamin D deficiency, unspecified: Secondary | ICD-10-CM | POA: Diagnosis not present

## 2022-01-01 DIAGNOSIS — Z0001 Encounter for general adult medical examination with abnormal findings: Secondary | ICD-10-CM | POA: Diagnosis not present

## 2022-01-01 DIAGNOSIS — D518 Other vitamin B12 deficiency anemias: Secondary | ICD-10-CM | POA: Diagnosis not present

## 2022-01-20 ENCOUNTER — Other Ambulatory Visit: Payer: Self-pay

## 2022-01-20 ENCOUNTER — Emergency Department (HOSPITAL_COMMUNITY): Payer: Medicare Other

## 2022-01-20 ENCOUNTER — Encounter (HOSPITAL_COMMUNITY): Payer: Self-pay | Admitting: Emergency Medicine

## 2022-01-20 ENCOUNTER — Emergency Department (HOSPITAL_COMMUNITY)
Admission: EM | Admit: 2022-01-20 | Discharge: 2022-01-20 | Disposition: A | Discharge status: Home / Self Care | Payer: Medicare Other | Attending: Emergency Medicine | Admitting: Emergency Medicine

## 2022-01-20 DIAGNOSIS — I10 Essential (primary) hypertension: Secondary | ICD-10-CM | POA: Insufficient documentation

## 2022-01-20 DIAGNOSIS — M546 Pain in thoracic spine: Secondary | ICD-10-CM | POA: Diagnosis not present

## 2022-01-20 DIAGNOSIS — I700 Atherosclerosis of aorta: Secondary | ICD-10-CM | POA: Insufficient documentation

## 2022-01-20 DIAGNOSIS — R0789 Other chest pain: Secondary | ICD-10-CM | POA: Insufficient documentation

## 2022-01-20 DIAGNOSIS — K5790 Diverticulosis of intestine, part unspecified, without perforation or abscess without bleeding: Secondary | ICD-10-CM | POA: Diagnosis not present

## 2022-01-20 DIAGNOSIS — Z79899 Other long term (current) drug therapy: Secondary | ICD-10-CM | POA: Insufficient documentation

## 2022-01-20 DIAGNOSIS — R079 Chest pain, unspecified: Secondary | ICD-10-CM | POA: Diagnosis not present

## 2022-01-20 DIAGNOSIS — Z7982 Long term (current) use of aspirin: Secondary | ICD-10-CM | POA: Diagnosis not present

## 2022-01-20 LAB — CBC WITH DIFFERENTIAL/PLATELET
Abs Immature Granulocytes: 0.02 10*3/uL (ref 0.00–0.07)
Basophils Absolute: 0.1 10*3/uL (ref 0.0–0.1)
Basophils Relative: 1 %
Eosinophils Absolute: 0.1 10*3/uL (ref 0.0–0.5)
Eosinophils Relative: 1 %
HCT: 42.9 % (ref 36.0–46.0)
Hemoglobin: 14.3 g/dL (ref 12.0–15.0)
Immature Granulocytes: 0 %
Lymphocytes Relative: 33 %
Lymphs Abs: 2.3 10*3/uL (ref 0.7–4.0)
MCH: 29.3 pg (ref 26.0–34.0)
MCHC: 33.3 g/dL (ref 30.0–36.0)
MCV: 87.9 fL (ref 80.0–100.0)
Monocytes Absolute: 0.4 10*3/uL (ref 0.1–1.0)
Monocytes Relative: 6 %
Neutro Abs: 4.1 10*3/uL (ref 1.7–7.7)
Neutrophils Relative %: 59 %
Platelets: 328 10*3/uL (ref 150–400)
RBC: 4.88 MIL/uL (ref 3.87–5.11)
RDW: 13.2 % (ref 11.5–15.5)
WBC: 6.9 10*3/uL (ref 4.0–10.5)
nRBC: 0 % (ref 0.0–0.2)

## 2022-01-20 LAB — URINALYSIS, ROUTINE W REFLEX MICROSCOPIC
Bacteria, UA: NONE SEEN
Bilirubin Urine: NEGATIVE
Glucose, UA: NEGATIVE mg/dL
Ketones, ur: 5 mg/dL — AB
Leukocytes,Ua: NEGATIVE
Nitrite: NEGATIVE
Protein, ur: NEGATIVE mg/dL
Specific Gravity, Urine: 1.014 (ref 1.005–1.030)
pH: 5 (ref 5.0–8.0)

## 2022-01-20 LAB — COMPREHENSIVE METABOLIC PANEL
ALT: 27 U/L (ref 0–44)
AST: 29 U/L (ref 15–41)
Albumin: 4.4 g/dL (ref 3.5–5.0)
Alkaline Phosphatase: 65 U/L (ref 38–126)
Anion gap: 10 (ref 5–15)
BUN: 17 mg/dL (ref 8–23)
CO2: 26 mmol/L (ref 22–32)
Calcium: 9.5 mg/dL (ref 8.9–10.3)
Chloride: 101 mmol/L (ref 98–111)
Creatinine, Ser: 0.96 mg/dL (ref 0.44–1.00)
GFR, Estimated: >60 mL/min (ref >60)
Glucose, Bld: 104 mg/dL — ABNORMAL HIGH (ref 70–99)
Potassium: 2.9 mmol/L — ABNORMAL LOW (ref 3.5–5.1)
Sodium: 137 mmol/L (ref 135–145)
Total Bilirubin: 0.6 mg/dL (ref 0.3–1.2)
Total Protein: 7.8 g/dL (ref 6.5–8.1)

## 2022-01-20 LAB — LIPASE, BLOOD: Lipase: 40 U/L (ref 11–51)

## 2022-01-20 LAB — MAGNESIUM: Magnesium: 2.1 mg/dL (ref 1.7–2.4)

## 2022-01-20 LAB — TROPONIN I (HIGH SENSITIVITY): Troponin I (High Sensitivity): 3 ng/L (ref <18)

## 2022-01-20 MED ORDER — OXYCODONE-ACETAMINOPHEN 5-325 MG PO TABS
2.0000 | ORAL_TABLET | Freq: Once | ORAL | Status: AC
  Administered 2022-01-20: 2 via ORAL
  Filled 2022-01-20: qty 2

## 2022-01-20 MED ORDER — METHOCARBAMOL 500 MG PO TABS
500.0000 mg | ORAL_TABLET | Freq: Once | ORAL | Status: AC
  Administered 2022-01-20: 500 mg via ORAL
  Filled 2022-01-20: qty 1

## 2022-01-20 MED ORDER — METHOCARBAMOL 500 MG PO TABS: 500.0000 mg | ORAL_TABLET | Freq: Three times a day (TID) | ORAL | 0 refills | Status: DC | PRN

## 2022-01-20 MED ORDER — POTASSIUM CHLORIDE 10 MEQ/100ML IV SOLN
10.0000 meq | Freq: Once | INTRAVENOUS | Status: AC
  Administered 2022-01-20: 10 meq via INTRAVENOUS
  Filled 2022-01-20: qty 100

## 2022-01-20 MED ORDER — LIDOCAINE 5 % EX PTCH
1.0000 | MEDICATED_PATCH | CUTANEOUS | Status: DC
  Administered 2022-01-20: 1 via TRANSDERMAL
  Filled 2022-01-20: qty 1

## 2022-01-20 MED ORDER — KETOROLAC TROMETHAMINE 15 MG/ML IJ SOLN
15.0000 mg | Freq: Once | INTRAMUSCULAR | Status: AC
  Administered 2022-01-20: 15 mg via INTRAVENOUS
  Filled 2022-01-20: qty 1

## 2022-01-20 MED ORDER — POTASSIUM CHLORIDE CRYS ER 20 MEQ PO TBCR
40.0000 meq | EXTENDED_RELEASE_TABLET | Freq: Once | ORAL | Status: AC
  Administered 2022-01-20: 40 meq via ORAL
  Filled 2022-01-20: qty 2

## 2022-01-20 MED ORDER — LIDOCAINE 5 % EX PTCH: 1.0000 | MEDICATED_PATCH | CUTANEOUS | 0 refills | Status: AC

## 2022-01-20 MED ORDER — IOHEXOL 350 MG/ML SOLN
100.0000 mL | Freq: Once | INTRAVENOUS | Status: AC | PRN
  Administered 2022-01-20: 100 mL via INTRAVENOUS

## 2022-01-20 NOTE — ED Provider Notes (Signed)
[facility] EMERGENCY DEPARTMENT Provider Note   CSN: [id number] Arrival date & time: 01/20/22  1406     History {Add pertinent medical, surgical, social history, OB history to HPI:1} No chief complaint on file.   Shelly Wood is a 66 y.o. female.  HPI Patient presents for chest and back pain.  Medical history includes HTN, migraines.  Yesterday evening, patient noticed a tightness and pain in her mid left-sided back.  Over the course of the night, pain radiated around the left side of her chest wall.  Today, she continued to have pain in the same areas.  Pain at times, has been severe.  She took a medication at home that starts with an M for analgesia but experienced only minimal relief.  Currently, she endorses pain in her mid left-sided back, left chest wall, including under her left breast.  Pain is most prominent in the area of her back.  It is worsened with movement.  It is not pleuritic.  She did receive a recent shingles shot.    Home Medications Prior to Admission medications   Medication Sig Start Date End Date Taking? Authorizing Provider  ACETAMINOPHEN-BUTALBITAL 50-325 MG TABS Take 1 tablet by mouth 2 (two) times daily as needed. 02/19/10   [provider]  Aspirin-Acetaminophen-Caffeine (EXCEDRIN MIGRAINE PO) Take 1 tablet by mouth as needed (pain, headaches).    [provider]  cholecalciferol (VITAMIN D) 1000 units tablet Take 1,000 Units by mouth daily.    [provider]  diazepam (VALIUM) 10 MG tablet Take 1 tablet by mouth at bedtime as needed for sleep. 10/30/16   [provider]  escitalopram (LEXAPRO) 10 MG tablet Take 10 mg by mouth daily. 11/17/16   [provider]  estradiol (ESTRACE) 1 MG tablet Take 1 tablet by mouth daily. 11/20/16   [provider]  hydrochlorothiazide (HYDRODIURIL) 25 MG tablet Take 25 mg by mouth daily. 11/16/16   [provider]  Levothyroxine Sodium 75 MCG CAPS Take 75-100 mcg  by mouth daily. 02/19/10   [provider]  medroxyPROGESTERone (PROVERA) 2.5 MG tablet Take 1 tablet by mouth daily. 11/20/16   [provider]      Allergies    Patient has no known allergies.    Review of Systems   Review of Systems  Cardiovascular:  Positive for chest pain.  Musculoskeletal:  Positive for back pain.  All other systems reviewed and are negative.   Physical Exam Updated Vital Signs Ht '5\' 7"'$  (1.702 m)   Wt 77.1 kg   BMI 26.63 kg/m  Physical Exam Vitals and nursing note reviewed.  Constitutional:      General: She is not in acute distress.    Appearance: Normal appearance. She is well-developed and normal weight. She is not ill-appearing, toxic-appearing or diaphoretic.  HENT:     Head: Normocephalic and atraumatic.     Right Ear: External ear normal.     Left Ear: External ear normal.     Nose: Nose normal.     Mouth/Throat:     Mouth: Mucous membranes are moist.     Pharynx: Oropharynx is clear.  Eyes:     Extraocular Movements: Extraocular movements intact.     Conjunctiva/sclera: Conjunctivae normal.  Cardiovascular:     Rate and Rhythm: Normal rate and regular rhythm.     Heart sounds: No murmur heard. Pulmonary:     Effort: Pulmonary effort is normal. No respiratory distress.     Breath sounds:  Normal breath sounds. No wheezing or rales.  Chest:     Chest wall: No tenderness.  Abdominal:     General: There is no distension.     Palpations: Abdomen is soft.     Tenderness: There is no abdominal tenderness.  Musculoskeletal:        General: Tenderness (Left thoracic back) present. No swelling.     Cervical back: Normal range of motion and neck supple.  Skin:    General: Skin is warm and dry.     Capillary Refill: Capillary refill takes less than 2 seconds.     Coloration: Skin is not jaundiced or pale.  Neurological:     General: No focal deficit present.     Mental Status: She is alert and oriented to person, place, and  time.     Cranial Nerves: No cranial nerve deficit.     Sensory: No sensory deficit.     Motor: No weakness.     Coordination: Coordination normal.  Psychiatric:        Mood and Affect: Mood normal.        Behavior: Behavior normal.        Thought Content: Thought content normal.        Judgment: Judgment normal.     ED Results / Procedures / Treatments   Labs (all labs ordered are listed, but only abnormal results are displayed) Labs Reviewed - No data to display  EKG EKG Interpretation  Date/Time:  Monday January 20 2022 14:15:58 EDT Ventricular Rate:  69 PR Interval:  196 QRS Duration: 78 QT Interval:  408 QTC Calculation: 437 R Axis:   66 Text Interpretation: Normal sinus rhythm Low voltage QRS Borderline ECG Confirmed by Godfrey Pick ([id number]) on 01/20/2022 3:04:41 PM  Radiology No results found.  Procedures Procedures  {Document cardiac monitor, telemetry assessment procedure when appropriate:1}  Medications Ordered in ED Medications - No data to display  ED Course/ Medical Decision Making/ A&P                           Medical Decision Making Amount and/or Complexity of Data Reviewed Labs: ordered. Radiology: ordered.  Risk Prescription drug management.   This patient presents to the ED for concern of ***, this involves an extensive number of treatment options, and is a complaint that carries with it a high risk of complications and morbidity.  The differential diagnosis includes ***   Co morbidities that complicate the patient evaluation  ***   Additional history obtained:  Additional history obtained from *** External records from outside source obtained and reviewed including ***   Lab Tests:  I Ordered, and personally interpreted labs.  The pertinent results include:  ***   Imaging Studies ordered:  I ordered imaging studies including ***  I independently visualized and interpreted imaging which showed *** I agree with the radiologist  interpretation   Cardiac Monitoring: / EKG:  The patient was maintained on a cardiac monitor.  I personally viewed and interpreted the cardiac monitored which showed an underlying rhythm of: ***   Consultations Obtained:  I requested consultation with the ***,  and discussed lab and imaging findings as well as pertinent plan - they recommend: ***   Problem List / ED Course / Critical interventions / Medication management  *** I ordered medication including ***  for ***  Reevaluation of the patient after these medicines showed that the patient {resolved/improved/worsened:23923::"improved"} I have reviewed the  patients home medicines and have made adjustments as needed   Social Determinants of Health:  ***   Test / Admission - Considered:  ***   {Document critical care time when appropriate:1} {Document review of labs and clinical decision tools ie heart score, Chads2Vasc2 etc:1}  {Document your independent review of radiology images, and any outside records:1} {Document your discussion with family members, caretakers, and with consultants:1} {Document social determinants of health affecting pt's care:1} {Document your decision making why or why not admission, treatments were needed:1} Final Clinical Impression(s) / ED Diagnoses Final diagnoses:  None    Rx / DC Orders ED Discharge Orders     None

## 2022-01-20 NOTE — ED Notes (Signed)
Pt had paused pump due to beeping. Educated pt on using call light next time pump starts beeping.

## 2022-01-20 NOTE — ED Notes (Signed)
Pt ambulated to the bathroom with standby assist 

## 2022-01-20 NOTE — ED Triage Notes (Signed)
Pt presents with chest pain under left breast that started while she was cooking dinner, radiating into back.

## 2022-01-20 NOTE — Discharge Instructions (Signed)
Your potassium was replaced in the ED.  You should get your lab work rechecked in 1 to 2 weeks to ensure that potassium levels stay normal.  You can continue to treat your pain with ibuprofen and Tylenol.  Additionally, prescriptions were sent to your pharmacy for lidocaine patches and a muscle relaxer.  Take the muscle relaxer as needed.  Avoid having more than 1 lidocaine patch on your skin at any given time.  You can try ice and heat to the areas of pain as well.  The symptoms should resolve with time.  Return to the emergency department at any time for any new or worsening symptoms of concern.

## 2022-01-21 DIAGNOSIS — M549 Dorsalgia, unspecified: Secondary | ICD-10-CM | POA: Diagnosis not present

## 2022-01-23 ENCOUNTER — Encounter (HOSPITAL_COMMUNITY): Payer: Self-pay | Admitting: Emergency Medicine

## 2022-01-23 ENCOUNTER — Emergency Department (HOSPITAL_COMMUNITY): Payer: Medicare Other

## 2022-01-23 ENCOUNTER — Other Ambulatory Visit: Payer: Self-pay

## 2022-01-23 ENCOUNTER — Emergency Department (HOSPITAL_COMMUNITY)
Admission: EM | Admit: 2022-01-23 | Discharge: 2022-01-23 | Disposition: A | Discharge status: Home / Self Care | Payer: Medicare Other | Attending: Emergency Medicine | Admitting: Emergency Medicine

## 2022-01-23 DIAGNOSIS — I700 Atherosclerosis of aorta: Secondary | ICD-10-CM | POA: Diagnosis not present

## 2022-01-23 DIAGNOSIS — Z9049 Acquired absence of other specified parts of digestive tract: Secondary | ICD-10-CM | POA: Diagnosis not present

## 2022-01-23 DIAGNOSIS — Z79899 Other long term (current) drug therapy: Secondary | ICD-10-CM | POA: Diagnosis not present

## 2022-01-23 DIAGNOSIS — I10 Essential (primary) hypertension: Secondary | ICD-10-CM | POA: Diagnosis not present

## 2022-01-23 DIAGNOSIS — R109 Unspecified abdominal pain: Secondary | ICD-10-CM | POA: Diagnosis not present

## 2022-01-23 DIAGNOSIS — M546 Pain in thoracic spine: Secondary | ICD-10-CM | POA: Insufficient documentation

## 2022-01-23 DIAGNOSIS — K5730 Diverticulosis of large intestine without perforation or abscess without bleeding: Secondary | ICD-10-CM | POA: Diagnosis not present

## 2022-01-23 DIAGNOSIS — R079 Chest pain, unspecified: Secondary | ICD-10-CM | POA: Diagnosis not present

## 2022-01-23 DIAGNOSIS — R0789 Other chest pain: Secondary | ICD-10-CM | POA: Diagnosis not present

## 2022-01-23 DIAGNOSIS — M160 Bilateral primary osteoarthritis of hip: Secondary | ICD-10-CM | POA: Diagnosis not present

## 2022-01-23 LAB — CBC
HCT: 46.7 % — ABNORMAL HIGH (ref 36.0–46.0)
Hemoglobin: 15.3 g/dL — ABNORMAL HIGH (ref 12.0–15.0)
MCH: 28.7 pg (ref 26.0–34.0)
MCHC: 32.8 g/dL (ref 30.0–36.0)
MCV: 87.6 fL (ref 80.0–100.0)
Platelets: 354 10*3/uL (ref 150–400)
RBC: 5.33 MIL/uL — ABNORMAL HIGH (ref 3.87–5.11)
RDW: 13.2 % (ref 11.5–15.5)
WBC: 7.8 10*3/uL (ref 4.0–10.5)
nRBC: 0 % (ref 0.0–0.2)

## 2022-01-23 LAB — URINALYSIS, ROUTINE W REFLEX MICROSCOPIC
Bacteria, UA: NONE SEEN
Bilirubin Urine: NEGATIVE
Glucose, UA: NEGATIVE mg/dL
Ketones, ur: 5 mg/dL — AB
Leukocytes,Ua: NEGATIVE
Nitrite: NEGATIVE
Protein, ur: NEGATIVE mg/dL
Specific Gravity, Urine: 1.011 (ref 1.005–1.030)
pH: 5 (ref 5.0–8.0)

## 2022-01-23 LAB — BASIC METABOLIC PANEL
Anion gap: 10 (ref 5–15)
BUN: 9 mg/dL (ref 8–23)
CO2: 25 mmol/L (ref 22–32)
Calcium: 9.5 mg/dL (ref 8.9–10.3)
Chloride: 103 mmol/L (ref 98–111)
Creatinine, Ser: 0.98 mg/dL (ref 0.44–1.00)
GFR, Estimated: >60 mL/min (ref >60)
Glucose, Bld: 122 mg/dL — ABNORMAL HIGH (ref 70–99)
Potassium: 3.3 mmol/L — ABNORMAL LOW (ref 3.5–5.1)
Sodium: 138 mmol/L (ref 135–145)

## 2022-01-23 LAB — TROPONIN I (HIGH SENSITIVITY)
Troponin I (High Sensitivity): 5 ng/L (ref <18)
Troponin I (High Sensitivity): 4 ng/L (ref <18)

## 2022-01-23 MED ORDER — OXYCODONE-ACETAMINOPHEN 5-325 MG PO TABS: 1.0000 | ORAL_TABLET | Freq: Four times a day (QID) | ORAL | 0 refills | Status: DC | PRN

## 2022-01-23 MED ORDER — LIDOCAINE 5 % EX PTCH: 1.0000 | MEDICATED_PATCH | CUTANEOUS | 0 refills | Status: DC

## 2022-01-23 MED ORDER — OXYCODONE-ACETAMINOPHEN 5-325 MG PO TABS
1.0000 | ORAL_TABLET | Freq: Once | ORAL | Status: AC
  Administered 2022-01-23: 1 via ORAL
  Filled 2022-01-23: qty 1

## 2022-01-23 MED ORDER — HYDROMORPHONE HCL 1 MG/ML IJ SOLN
1.0000 mg | Freq: Once | INTRAMUSCULAR | Status: AC
  Administered 2022-01-23: 1 mg via INTRAMUSCULAR
  Filled 2022-01-23: qty 1

## 2022-01-23 MED ORDER — LIDOCAINE 5 % EX PTCH
1.0000 | MEDICATED_PATCH | CUTANEOUS | Status: DC
Start: 2022-01-23 — End: 2022-01-23
  Administered 2022-01-23: 1 via TRANSDERMAL
  Filled 2022-01-23: qty 1

## 2022-01-23 NOTE — ED Provider Notes (Signed)
[facility] EMERGENCY DEPARTMENT Provider Note   CSN: [id number] Arrival date & time: 01/23/22  4818     History  Chief Complaint  Patient presents with   Back Pain   Chest Pain    Shelly Wood is a 66 y.o. female.   Back Pain Associated symptoms: chest pain   Chest Pain Associated symptoms: back pain      Patient has a history of hypertension, pulmonary disease, migraines, vertigo who presents to the ED with complaints of back pain rating underneath her breast.  Patient was initially seen in emergency room on August 14.  At that time she complained of pain in her mid left back that started the day before.  Pain was rating to the chest wall.  Pain was not pleuritic.  It did increase with movement.  Patient had recently received a shingles shot.  Patient was seen at [facility] at that time had a CT angiogram of her chest abdomen pelvis.  Acute findings were noted on the CAT scan.  Home Medications Prior to Admission medications   Medication Sig Start Date End Date Taking? Authorizing Provider  lidocaine (LIDODERM) 5 % Place 1 patch onto the skin daily. Remove & Discard patch within 12 hours or as directed by MD 01/23/22  Yes Dorie Rank, MD  oxyCODONE-acetaminophen (PERCOCET/ROXICET) 5-325 MG tablet Take 1 tablet by mouth every 6 (six) hours as needed for severe pain. 01/23/22  Yes Dorie Rank, MD  ACETAMINOPHEN-BUTALBITAL 50-325 MG TABS Take 1 tablet by mouth 2 (two) times daily as needed. 02/19/10   [provider]  Aspirin-Acetaminophen-Caffeine (EXCEDRIN MIGRAINE PO) Take 1 tablet by mouth as needed (pain, headaches).    [provider]  cholecalciferol (VITAMIN D) 1000 units tablet Take 1,000 Units by mouth daily.    [provider]  diazepam (VALIUM) 10 MG tablet Take 1 tablet by mouth at bedtime as needed for sleep. 10/30/16   [provider]  escitalopram (LEXAPRO) 10 MG tablet Take 10 mg by mouth daily. 11/17/16    [provider]  estradiol (ESTRACE) 1 MG tablet Take 1 tablet by mouth daily. 11/20/16   [provider]  hydrochlorothiazide (HYDRODIURIL) 25 MG tablet Take 25 mg by mouth daily. 11/16/16   [provider]  Levothyroxine Sodium 75 MCG CAPS Take 75-100 mcg by mouth daily. 02/19/10   [provider]  lidocaine (LIDODERM) 5 % Place 1 patch onto the skin daily for 5 doses. Remove & Discard patch within 12 hours or as directed by MD 01/20/22 01/25/22  Godfrey Pick, MD  medroxyPROGESTERone (PROVERA) 2.5 MG tablet Take 1 tablet by mouth daily. 11/20/16   [provider]  methocarbamol (ROBAXIN) 500 MG tablet Take 1 tablet (500 mg total) by mouth every 8 (eight) hours as needed for muscle spasms. 01/20/22   Godfrey Pick, MD      Allergies    Patient has no known allergies.    Review of Systems   Review of Systems  Cardiovascular:  Positive for chest pain.  Musculoskeletal:  Positive for back pain.    Physical Exam Updated Vital Signs BP (!) 150/98 (BP Location: Right Arm)   Pulse 80   Temp 98.6 F (37 C) (Oral)   Resp 19   SpO2 98%  Physical Exam Vitals and nursing note reviewed.  Constitutional:      General: She is not in acute distress.    Appearance: She is well-developed.  HENT:  Head: Normocephalic and atraumatic.     Right Ear: External ear normal.     Left Ear: External ear normal.  Eyes:     General: No scleral icterus.       Right eye: No discharge.        Left eye: No discharge.     Conjunctiva/sclera: Conjunctivae normal.  Neck:     Trachea: No tracheal deviation.  Cardiovascular:     Rate and Rhythm: Normal rate and regular rhythm.  Pulmonary:     Effort: Pulmonary effort is normal. No respiratory distress.     Breath sounds: Normal breath sounds. No stridor. No wheezing or rales.  Abdominal:     General: Bowel sounds are normal. There is no distension.     Palpations: Abdomen is soft.     Tenderness: There is no  abdominal tenderness. There is no guarding or rebound.  Musculoskeletal:        General: No tenderness or deformity.     Cervical back: Neck supple.     Comments: Tenderness palpation lower and mid thoracic region primarily on the left side, paraspinal muscle tenderness, no crepitus no swelling,  Skin:    General: Skin is warm and dry.     Findings: No rash.  Neurological:     General: No focal deficit present.     Mental Status: She is alert.     Cranial Nerves: No cranial nerve deficit (no facial droop, extraocular movements intact, no slurred speech).     Sensory: No sensory deficit.     Motor: No abnormal muscle tone or seizure activity.     Coordination: Coordination normal.  Psychiatric:        Mood and Affect: Mood normal.     ED Results / Procedures / Treatments   Labs (all labs ordered are listed, but only abnormal results are displayed) Labs Reviewed  BASIC METABOLIC PANEL - Abnormal; Notable for the following components:      Result Value   Potassium 3.3 (*)    Glucose, Bld 122 (*)    All other components within normal limits  CBC - Abnormal; Notable for the following components:   RBC 5.33 (*)    Hemoglobin 15.3 (*)    HCT 46.7 (*)    All other components within normal limits  URINALYSIS, ROUTINE W REFLEX MICROSCOPIC - Abnormal; Notable for the following components:   Hgb urine dipstick MODERATE (*)    Ketones, ur 5 (*)    All other components within normal limits  TROPONIN I (HIGH SENSITIVITY)  TROPONIN I (HIGH SENSITIVITY)    EKG EKG Interpretation  Date/Time:  Thursday January 23 2022 07:39:44 EDT Ventricular Rate:  72 PR Interval:  186 QRS Duration: 84 QT Interval:  394 QTC Calculation: 431 R Axis:   82 Text Interpretation: Normal sinus rhythm Cannot rule out Anterior infarct , age undetermined Abnormal ECG When compared with ECG of 20-Jan-2022 14:15, No significant change since last tracing Confirmed by Dorie Rank [id number]) on 01/23/2022 1:39:22  PM  Radiology CT Renal Stone Study  Result Date: 01/23/2022 CLINICAL DATA:  Flank pain, renal stone suspected. EXAM: CT ABDOMEN AND PELVIS WITHOUT CONTRAST TECHNIQUE: Multidetector CT imaging of the abdomen and pelvis was performed following the standard protocol without IV contrast. RADIATION DOSE REDUCTION: This exam was performed according to the departmental dose-optimization program which includes automated exposure control, adjustment of the mA and/or kV according to patient size and/or use of iterative reconstruction technique. COMPARISON:  CT January 20, 2022 FINDINGS: Lower chest: No acute abnormality. Hepatobiliary: Unremarkable noncontrast enhanced appearance of the liver. Gallbladder surgically absent. No biliary ductal dilation. Pancreas: No pancreatic ductal dilation or evidence of acute inflammation. Spleen: No splenomegaly or focal splenic lesion. Adrenals/Urinary Tract: Bilateral adrenal glands appear normal. No hydronephrosis. No renal, ureteral or bladder calculi. Urinary bladder is unremarkable for degree of distension. Stomach/Bowel: No radiopaque enteric contrast material was administered. Stomach is unremarkable for degree of distension. No pathologic dilation of small or large bowel. Sigmoid colonic diverticulosis without findings of acute diverticulitis. Normal appendix. Vascular/Lymphatic: Aortic atherosclerosis. No pathologically enlarged abdominal or pelvic lymph nodes. Reproductive: Uterus and bilateral adnexa are unremarkable. Other: No significant abdominopelvic free fluid. Musculoskeletal: Multilevel degenerative changes spine. Degenerative changes bilateral hips. No acute osseous abnormality. IMPRESSION: 1. No acute abnormality identified in the abdomen or pelvis. 2. Colonic diverticulosis without findings of acute diverticulitis. 3.  Aortic Atherosclerosis (ICD10-I70.0). Electronically Signed   By: Dahlia Bailiff M.D.   On: 01/23/2022 12:48   DG Chest 2 View  Result Date:  01/23/2022 CLINICAL DATA:  Upper back pain radiating into the left breast EXAM: CHEST - 2 VIEW COMPARISON:  01/20/2022 chest CT; chest radiograph also of 01/20/2022 FINDINGS: Mild thoracic spondylosis.  Mild cardiomegaly. The lungs appear clear. No blunting of the costophrenic angles. No acute bony findings. IMPRESSION: 1. No acute findings. 2. Mild cardiomegaly. 3. Thoracic spondylosis. Electronically Signed   By: Van Clines M.D.   On: 01/23/2022 08:15    Procedures Procedures    Medications Ordered in ED Medications  lidocaine (LIDODERM) 5 % 1 patch (has no administration in time range)  HYDROmorphone (DILAUDID) injection 1 mg (has no administration in time range)  oxyCODONE-acetaminophen (PERCOCET/ROXICET) 5-325 MG per tablet 1 tablet (1 tablet Oral Given 01/23/22 1118)    ED Course/ Medical Decision Making/ A&P Clinical Course as of 01/23/22 1441  Thu Jan 23, 2022  1342 CBC unremarkable.  Electrolyte panel shows mild decrease in potassium.  Patient's troponin is normal [JK]  1343 Urinalysis, Routine w reflex microscopic(!) Urine does show moderate hemoglobin but otherwise no significant abnormalities [JK]  1343 CT renal stone study today does not show any acute abnormalities [JK]    Clinical Course User Index [JK] Dorie Rank, MD                           Medical Decision Making Amount and/or Complexity of Data Reviewed External Data Reviewed: labs, radiology and notes.    Details: Reviewed imaging test and laboratory tests from the evaluation earlier this week.  Patient did have CT scan of the chest CT scan of the abdomen pelvis.  No acute abnormalities were noted then. Labs: ordered. Radiology: ordered.   Patient presented with complaints of persistent left-sided posterior back pain.  Pains are sharp in nature.  Patient states is very intense and severe.  She cannot lie in certain positions without having severe pain.  ED work-up fortunately is reassuring.  She is not  have any signs of acute cardiac ischemia.  She did have a CT scan of her chest abdomen pelvis this week that did not show any evidence of mass, inflammation, dissection, aneurysm, pulmonary embolism.  Patient did have hematuria on the urinalysis.  CT scan was performed and there is no evidence of renal stones or ureteral colic.  No signs of skin infection or rash.  Suspect this pain is musculoskeletal in nature.  It is possible she could  have a radiculopathy.  Will DC home with prescription for medication for pain.  Outpatient follow-up with PCP.  Consider further testing such as MRI if symptoms persist        Final Clinical Impression(s) / ED Diagnoses Final diagnoses:  Acute left-sided thoracic back pain    Rx / DC Orders ED Discharge Orders          Ordered    oxyCODONE-acetaminophen (PERCOCET/ROXICET) 5-325 MG tablet  Every 6 hours PRN        01/23/22 1437    lidocaine (LIDODERM) 5 %  Every 24 hours        01/23/22 1437              Dorie Rank, MD 01/23/22 1441

## 2022-01-23 NOTE — ED Notes (Signed)
Discharge instructions reviewed with patient. Patient verbalized understanding of instructions. Follow-up care and medications were reviewed. Patient wheeled out of ED and helped into POV.

## 2022-01-23 NOTE — ED Triage Notes (Signed)
Patient presents from home complaining of upper back pain that radiates underneath her left breast. Describes it as a stabbing, intermittent pain. Was seen at [facility] earlier this week with a negative workup but pain has persisted. Patient states "it hurts so bad it feels like my heart is fluttering". Pt reports feeling nauseated due to the pain.

## 2022-01-23 NOTE — ED Notes (Signed)
This RN tried calling [facility] per MD Knapp's request to cancel previous prescription. The [facility] pharmacy line was busy and the the call could not be completed. MD notified.

## 2022-01-23 NOTE — Discharge Instructions (Signed)
The medications as needed for pain and discomfort.  Follow-up with your primary care doctor to be rechecked.

## 2022-01-29 DIAGNOSIS — I10 Essential (primary) hypertension: Secondary | ICD-10-CM | POA: Diagnosis not present

## 2022-01-29 DIAGNOSIS — M5414 Radiculopathy, thoracic region: Secondary | ICD-10-CM | POA: Diagnosis not present

## 2022-01-29 DIAGNOSIS — M1991 Primary osteoarthritis, unspecified site: Secondary | ICD-10-CM | POA: Diagnosis not present

## 2022-01-29 DIAGNOSIS — M549 Dorsalgia, unspecified: Secondary | ICD-10-CM | POA: Diagnosis not present

## 2022-01-29 DIAGNOSIS — H35341 Macular cyst, hole, or pseudohole, right eye: Secondary | ICD-10-CM | POA: Diagnosis not present

## 2022-01-30 DIAGNOSIS — M5414 Radiculopathy, thoracic region: Secondary | ICD-10-CM | POA: Diagnosis not present

## 2022-02-03 DIAGNOSIS — I700 Atherosclerosis of aorta: Secondary | ICD-10-CM | POA: Diagnosis not present

## 2022-02-03 DIAGNOSIS — T50905A Adverse effect of unspecified drugs, medicaments and biological substances, initial encounter: Secondary | ICD-10-CM | POA: Diagnosis not present

## 2022-02-03 DIAGNOSIS — M549 Dorsalgia, unspecified: Secondary | ICD-10-CM | POA: Diagnosis not present

## 2022-02-03 DIAGNOSIS — M1991 Primary osteoarthritis, unspecified site: Secondary | ICD-10-CM | POA: Diagnosis not present

## 2022-02-03 DIAGNOSIS — R1012 Left upper quadrant pain: Secondary | ICD-10-CM | POA: Diagnosis not present

## 2022-02-03 DIAGNOSIS — I10 Essential (primary) hypertension: Secondary | ICD-10-CM | POA: Diagnosis not present

## 2022-02-03 DIAGNOSIS — M5414 Radiculopathy, thoracic region: Secondary | ICD-10-CM | POA: Diagnosis not present

## 2022-02-05 ENCOUNTER — Other Ambulatory Visit: Payer: Self-pay | Admitting: Internal Medicine

## 2022-02-05 ENCOUNTER — Ambulatory Visit: Payer: Medicare Other | Admitting: Dermatology

## 2022-02-05 ENCOUNTER — Other Ambulatory Visit (HOSPITAL_COMMUNITY): Payer: Self-pay | Admitting: Internal Medicine

## 2022-02-05 DIAGNOSIS — M5414 Radiculopathy, thoracic region: Secondary | ICD-10-CM

## 2022-02-14 ENCOUNTER — Ambulatory Visit (HOSPITAL_BASED_OUTPATIENT_CLINIC_OR_DEPARTMENT_OTHER)
Admission: RE | Admit: 2022-02-14 | Discharge: 2022-02-14 | Disposition: A | Discharge status: Home / Self Care | Payer: Medicare Other | Source: Ambulatory Visit | Attending: Internal Medicine | Admitting: Internal Medicine

## 2022-02-14 DIAGNOSIS — M546 Pain in thoracic spine: Secondary | ICD-10-CM | POA: Diagnosis not present

## 2022-02-14 DIAGNOSIS — M47814 Spondylosis without myelopathy or radiculopathy, thoracic region: Secondary | ICD-10-CM | POA: Diagnosis not present

## 2022-02-14 DIAGNOSIS — M5414 Radiculopathy, thoracic region: Secondary | ICD-10-CM

## 2022-02-24 ENCOUNTER — Encounter: Payer: Self-pay | Admitting: Internal Medicine

## 2022-02-25 DIAGNOSIS — M47814 Spondylosis without myelopathy or radiculopathy, thoracic region: Secondary | ICD-10-CM | POA: Diagnosis not present

## 2022-02-25 DIAGNOSIS — M5414 Radiculopathy, thoracic region: Secondary | ICD-10-CM | POA: Diagnosis not present

## 2022-02-25 DIAGNOSIS — M1991 Primary osteoarthritis, unspecified site: Secondary | ICD-10-CM | POA: Diagnosis not present

## 2022-02-25 DIAGNOSIS — M5114 Intervertebral disc disorders with radiculopathy, thoracic region: Secondary | ICD-10-CM | POA: Diagnosis not present

## 2022-03-13 ENCOUNTER — Ambulatory Visit: Payer: Medicare Other | Admitting: Dermatology

## 2022-03-13 DIAGNOSIS — Z79899 Other long term (current) drug therapy: Secondary | ICD-10-CM

## 2022-03-13 DIAGNOSIS — L820 Inflamed seborrheic keratosis: Secondary | ICD-10-CM

## 2022-03-13 DIAGNOSIS — Z5111 Encounter for antineoplastic chemotherapy: Secondary | ICD-10-CM

## 2022-03-13 DIAGNOSIS — L578 Other skin changes due to chronic exposure to nonionizing radiation: Secondary | ICD-10-CM | POA: Diagnosis not present

## 2022-03-13 DIAGNOSIS — L570 Actinic keratosis: Secondary | ICD-10-CM

## 2022-03-13 MED ORDER — FLUOROURACIL 5 % EX CREA: TOPICAL_CREAM | Freq: Two times a day (BID) | CUTANEOUS | 0 refills | Status: DC

## 2022-03-13 NOTE — Progress Notes (Signed)
Follow-Up Visit   Subjective  Shelly Wood is a 66 y.o. female who presents for the following: check spot (R medial canthus/nose, ~ 64m feels raw/L hand dorsum, not sure how long it has been there, rough spot). The patient has spots, moles and lesions to be evaluated, some may be new or changing and the patient has concerns that these could be cancer.  The following portions of the chart were reviewed this encounter and updated as appropriate:   Tobacco  Allergies  Meds  Problems  Med Hx  Surg Hx  Fam Hx     Review of Systems:  No other skin or systemic complaints except as noted in HPI or Assessment and Plan.  Objective  Well appearing patient in no apparent distress; mood and affect are within normal limits.  A focused examination was performed including face, left dorsum hand. Relevant physical exam findings are noted in the Assessment and Plan.  R nasal bridge Pink scaly macule 1.0cm  L hand dorsum x 1 Stuck on waxy paps with erythema   Assessment & Plan  AK (actinic keratosis) R nasal bridge Starting 04/09/2022 - Start 5-fluorouracil/calcipotriene cream twice a day for 7 days to affected areas including R nasal bridge. Prescription sent to [facility]. Patient advised they will receive an email to purchase the medication online and have it sent to their home. Patient provided with handout reviewing treatment course and side effects and advised to call or message [email or web address] with any concerns.  Reviewed course of treatment and expected reaction.  Patient advised to expect inflammation and crusting and advised that erosions are possible.  Patient advised to be diligent with sun protection during and after treatment. Counseled to keep medication out of reach of children and pets.   Destruction of lesion - R nasal bridge Complexity: simple   Destruction method: cryotherapy   Informed consent: discussed and consent obtained   Timeout:  patient  name, date of birth, surgical site, and procedure verified Lesion destroyed using liquid nitrogen: Yes   Region frozen until ice ball extended beyond lesion: Yes   Outcome: patient tolerated procedure well with no complications   Post-procedure details: wound care instructions given    fluorouracil (EFUDEX) 5 % cream - R nasal bridge Apply topically 2 (two) times daily. Starting 04/09/2022 apply to right nasal bridge bid for 7 days  Inflamed seborrheic keratosis L hand dorsum x 1 Symptomatic, irritating, patient would like treated. Destruction of lesion - L hand dorsum x 1 Complexity: simple   Destruction method: cryotherapy   Informed consent: discussed and consent obtained   Timeout:  patient name, date of birth, surgical site, and procedure verified Lesion destroyed using liquid nitrogen: Yes   Region frozen until ice ball extended beyond lesion: Yes   Outcome: patient tolerated procedure well with no complications   Post-procedure details: wound care instructions given    Actinic Damage - chronic, secondary to cumulative UV radiation exposure/sun exposure over time - diffuse scaly erythematous macules with underlying dyspigmentation - Recommend daily broad spectrum sunscreen SPF 30+ to sun-exposed areas, reapply every 2 hours as needed.  - Recommend staying in the shade or wearing long sleeves, sun glasses (UVA+UVB protection) and wide brim hats (4-inch brim around the entire circumference of the hat). - Call for new or changing lesions.  Return in about 3 months (around 06/13/2022) for recheck AK R nasal bridge, recheck ISK L dorsum hand.  I, Sonya Hupman, RMA, am acting  as scribe for Sarina Ser, MD . Documentation: I have reviewed the above documentation for accuracy and completeness, and I agree with the above.  Sarina Ser, MD

## 2022-03-13 NOTE — Patient Instructions (Addendum)
Starting 04/09/2022 - Start 5-fluorouracil/calcipotriene cream twice a day for 7 days to affected areas including right nasal bridge. Prescription sent to [facility]. Patient advised they will receive an email to purchase the medication online and have it sent to their home. Patient provided with handout reviewing treatment course and side effects and advised to call or message [place] on MyChart with any concerns.  Reviewed course of treatment and expected reaction.  Patient advised to expect inflammation and crusting and advised that erosions are possible.  Patient advised to be diligent with sun protection during and after treatment. Counseled to keep medication out of reach of children and pets.    Instructions for [facility]  One or more of your medications was sent to the [facility]. You will receive an email from them and can purchase the medicine through that link. It will then be mailed to your home at the address you confirmed. If for any reason you do not receive an email from them, please check your spam folder. If you still do not find the email, please let us know. [facility] phone number is [phone].    5-Fluorouracil/Calcipotriene Patient Education   Actinic keratoses are the dry, red scaly spots on the skin caused by sun damage. A portion of these spots can turn into skin cancer with time, and treating them can help prevent development of skin cancer.   Treatment of these spots requires removal of the defective skin cells. There are various ways to remove actinic keratoses, including freezing with liquid nitrogen, treatment with creams, or treatment with a blue light procedure in the office.   5-fluorouracil cream is a topical cream used to treat actinic keratoses. It works by interfering with the growth of abnormal fast-growing skin cells, such as actinic keratoses. These cells peel off and are  replaced by healthy ones.   5-fluorouracil/calcipotriene is a combination of the 5-fluorouracil cream with a vitamin D analog cream called calcipotriene. The calcipotriene alone does not treat actinic keratoses. However, when it is combined with 5-fluorouracil, it helps the 5-fluorouracil treat the actinic keratoses much faster so that the same results can be achieved with a much shorter treatment time.  INSTRUCTIONS FOR 5-FLUOROURACIL/CALCIPOTRIENE CREAM:   5-fluorouracil/calcipotriene cream typically only needs to be used for 4-7 days. A thin layer should be applied twice a day to the treatment areas recommended by your physician.   If your physician prescribed you separate tubes of 5-fluourouracil and calcipotriene, apply a thin layer of 5-fluorouracil followed by a thin layer of calcipotriene.   Avoid contact with your eyes, nostrils, and mouth. Do not use 5-fluorouracil/calcipotriene cream on infected or open wounds.   You will develop redness, irritation and some crusting at areas where you have pre-cancer damage/actinic keratoses. IF YOU DEVELOP PAIN, BLEEDING, OR SIGNIFICANT CRUSTING, STOP THE TREATMENT EARLY - you have already gotten a good response and the actinic keratoses should clear up well.  Wash your hands after applying 5-fluorouracil 5% cream on your skin.   A moisturizer or sunscreen with a minimum SPF 30 should be applied each morning.   Once you have finished the treatment, you can apply a thin layer of Vaseline twice a day to irritated areas to soothe and calm the areas more quickly. If you experience significant discomfort, contact your physician.  For some patients it is necessary to repeat the treatment for best results.  SIDE EFFECTS: When using 5-fluorouracil/calcipotriene cream, you may have mild  irritation, such as redness, dryness, swelling, or a mild burning sensation. This usually resolves within 2 weeks. The more actinic keratoses you have, the more redness and  inflammation you can expect during treatment. Eye irritation has been reported rarely. If this occurs, please let us know.  If you have any trouble using this cream, please call the office. If you have any other questions about this information, please do not hesitate to ask me before you leave the office.   Actinic Keratosis  What is an actinic keratosis? An actinic keratosis (plural: actinic keratoses) is growth on the surface of the skin that usually appears as a red, hard, crusty or scaly bump.   What causes actinic keratoses? Repeated prolonged sun exposure causes skin damage, especially in fair-skinned persons. Sun-damaged skin becomes dry and wrinkled and may form rough, scaly spots called actinic keratoses. These rough spots remain on the skin even though the crust or scale on top is picked off.   Why treat actinic keratoses? Actinic keratoses are not skin cancers, but because they may sometimes turn cancerous they are called "pre-cancerous". Not all will turn to skin cancer, and it usually takes several years for this to happen. Because it is much easier to treat an actinic keratosis then it is to remove a skin cancer, actinic keratoses should be treated to prevent future skin cancer.   How are actinic keratoses treated? The most common way of treating actinic keratoses is to freeze them with liquid nitrogen. Freezing causes scabbing and shedding of the sun-damaged skin. Healing after a removal usually takes two weeks, depending on the size and location of the keratosis. Hands and legs heal more slowly than the face. The skin's final appearance is usually excellent. There are several topical medications that can be used to treat actinic keratoses. These medications generally have side effects of redness, crusting, and pain. Some are used for a few days, and some for several months before the actinic keratosis is completely gone. Photodynamic therapy is another alternative to freezing actinic  keratoses. This treatment is done in a physician's office. A medication is applied to the area of skin with actinic keratoses, and it is allowed to soak in for one or more hours. A special light is then applied to the skin. Side effects include redness, burning, and peeling.  How can you prevent actinic keratoses? Protection from the sun is the best way to prevent actinic keratoses. The use of proper clothing and sunscreens can prevent the sun damage that leads to an actinic keratosis.  Unfortunately, some sun damage is permanent. Once sun damage has progressed to the point where actinic keratoses develop, new keratoses may appear even without further sun exposure. However, even in skin that is already heavily sun damaged, good sun protection can help reduce the number of actinic keratoses that will appear.    Due to recent changes in healthcare laws, you may see results of your pathology and/or laboratory studies on MyChart before the doctors have had a chance to review them. We understand that in some cases there may be results that are confusing or concerning to you. Please understand that not all results are received at the same time and often the doctors may need to interpret multiple results in order to provide you with the best plan of care or course of treatment. Therefore, we ask that you please give [place] 2 business days to thoroughly review all your results before contacting the office for clarification. Should we see  a critical lab result, you will be contacted sooner.   If You Need Anything After Your Visit  If you have any questions or concerns for your doctor, please call our main line at [phone] and press option 4 to reach your doctor's medical assistant. If no one answers, please leave a voicemail as directed and we will return your call as soon as possible. Messages left after 4 pm will be answered the following business day.   You may also send [place] a message via [place]. We typically  respond to MyChart messages within 1-2 business days.  For prescription refills, please ask your pharmacy to contact our office. Our fax number is [phone].  If you have an urgent issue when the clinic is closed that cannot wait until the next business day, you can page your doctor at the number below.    Please note that while we do our best to be available for urgent issues outside of office hours, we are not available 24/7.   If you have an urgent issue and are unable to reach [place], you may choose to seek medical care at your doctor's office, retail clinic, urgent care center, or emergency room.  If you have a medical emergency, please immediately call 911 or go to the emergency department.  Pager Numbers  - Dr. Nehemiah Massed: [phone]  - Dr. Laurence Ferrari: [phone]  - Dr. Nicole Kindred: [phone]  In the event of inclement weather, please call our main line at [phone] for an update on the status of any delays or closures.  Dermatology Medication Tips: Please keep the boxes that topical medications come in in order to help keep track of the instructions about where and how to use these. Pharmacies typically print the medication instructions only on the boxes and not directly on the medication tubes.   If your medication is too expensive, please contact our office at [phone] option 4 or send [place] a message through [place].   We are unable to tell what your co-pay for medications will be in advance as this is different depending on your insurance coverage. However, we may be able to find a substitute medication at lower cost or fill out paperwork to get insurance to cover a needed medication.   If a prior authorization is required to get your medication covered by your insurance company, please allow [place] 1-2 business days to complete this process.  Drug prices often vary depending on where the prescription is filled and some pharmacies may offer cheaper prices.  The website  www.goodrx.com contains coupons for medications through different pharmacies. The prices here do not account for what the cost may be with help from insurance (it may be cheaper with your insurance), but the website can give you the price if you did not use any insurance.  - You can print the associated coupon and take it with your prescription to the pharmacy.  - You may also stop by our office during regular business hours and pick up a GoodRx coupon card.  - If you need your prescription sent electronically to a different pharmacy, notify our office through [facility] or by phone at [phone] option 4.     Si Usted Necesita Algo Despus de Su Visita  Tambin puede enviarnos un mensaje a travs de Pharmacist, community. Por lo general respondemos a los mensajes de MyChart en el transcurso de 1 a 2 das hbiles.  Para renovar recetas, por favor pida a su farmacia que se ponga en contacto con  nuestra oficina. Harland Dingwall de fax es [place] [phone].  Si tiene un asunto urgente cuando la clnica est cerrada y que no puede esperar hasta el siguiente da hbil, puede llamar/localizar a su doctor(a) al nmero que aparece a continuacin.   Por favor, tenga en cuenta que aunque hacemos todo lo posible para estar disponibles para asuntos urgentes fuera del horario de [place], no estamos disponibles las 24 horas del da, los 7 das de la [place].   Si tiene un problema urgente y no puede comunicarse con nosotros, puede optar por buscar atencin mdica  en el consultorio de su doctor(a), en una clnica privada, en un centro de atencin urgente o en una sala de emergencias.  Si tiene Engineering geologist, por favor llame inmediatamente al 911 o vaya a la sala de emergencias.  Nmeros de bper  - Dr. Nehemiah Massed: [phone]  - Dra. Moye: [phone]  - Dra. Nicole Kindred: [phone]  En caso de inclemencias del [place], por favor llame a Johnsie Kindred principal al [phone] para una actualizacin  sobre el [place] de cualquier retraso o cierre.  Consejos para la medicacin en dermatologa: Por favor, guarde las cajas en las que vienen los medicamentos de uso tpico para ayudarle a seguir las instrucciones sobre dnde y cmo usarlos. Las farmacias generalmente imprimen las instrucciones del medicamento slo en las cajas y no directamente en los tubos del [place].   Si su medicamento es muy caro, por favor, pngase en contacto con Zigmund Daniel llamando al [phone] y presione la opcin 4 o envenos un mensaje a travs de Pharmacist, community.   No podemos decirle cul ser su copago por los medicamentos por adelantado ya que esto es diferente dependiendo de la cobertura de su seguro. Sin embargo, es posible que podamos encontrar un medicamento sustituto a Electrical engineer un formulario para que el seguro cubra el medicamento que se considera necesario.   Si se requiere una autorizacin previa para que su compaa de seguros [place] su medicamento, por favor permtanos de 1 a 2 das hbiles para completar este proceso.  Los precios de los medicamentos varan con frecuencia dependiendo del Environmental consultant de dnde se surte la receta y alguna farmacias pueden ofrecer precios ms baratos.  El sitio web www.goodrx.com tiene cupones para medicamentos de Airline pilot. Los precios aqu no tienen en cuenta lo que podra costar con la ayuda del seguro (puede ser ms barato con su seguro), pero el sitio web puede darle el precio si no utiliz Research scientist (physical sciences).  - Puede imprimir el cupn correspondiente y llevarlo con su receta a la farmacia.  - Tambin puede pasar por nuestra oficina durante el horario de atencin regular y Charity fundraiser una tarjeta de cupones de GoodRx.  - Si necesita que su receta se enve electrnicamente a una farmacia diferente, informe a nuestra oficina a travs de MyChart de [place] o por telfono llamando al [phone] y presione la opcin 4.

## 2022-03-18 ENCOUNTER — Encounter: Payer: Self-pay | Admitting: Dermatology

## 2022-03-19 DIAGNOSIS — M5124 Other intervertebral disc displacement, thoracic region: Secondary | ICD-10-CM | POA: Diagnosis not present

## 2022-04-02 ENCOUNTER — Ambulatory Visit: Payer: Medicare Other | Admitting: Internal Medicine

## 2022-05-05 DIAGNOSIS — H35341 Macular cyst, hole, or pseudohole, right eye: Secondary | ICD-10-CM | POA: Diagnosis not present

## 2022-05-09 ENCOUNTER — Ambulatory Visit
Admission: EM | Admit: 2022-05-09 | Discharge: 2022-05-09 | Disposition: A | Discharge status: Home / Self Care | Payer: Medicare Other | Attending: Family Medicine | Admitting: Family Medicine

## 2022-05-09 DIAGNOSIS — J0100 Acute maxillary sinusitis, unspecified: Secondary | ICD-10-CM | POA: Diagnosis not present

## 2022-05-09 MED ORDER — AMOXICILLIN-POT CLAVULANATE 875-125 MG PO TABS: 1.0000 | ORAL_TABLET | Freq: Two times a day (BID) | ORAL | 0 refills | Status: DC

## 2022-05-09 MED ORDER — FLUTICASONE PROPIONATE 50 MCG/ACT NA SUSP: 1.0000 | Freq: Two times a day (BID) | NASAL | 2 refills | Status: DC

## 2022-05-09 NOTE — ED Triage Notes (Signed)
Pt reports has had some sinus issues for 2 weeks. She has some coughing, horse voice, sore throat she can't swallow. Took tylenol otc cough meds but no relief. Feels weak and body aches

## 2022-05-09 NOTE — ED Provider Notes (Signed)
[facility]    CSN: [id number] Arrival date & time: 05/09/22  1029      History   Chief Complaint No chief complaint on file.   HPI Shelly Wood is a 66 y.o. female.   Presenting today with 2-week history of progressively worsening nasal congestion, facial pain and pressure, sore throat, fatigue, mild cough.  Denies chest pain, shortness of breath, abdominal pain, nausea vomiting or diarrhea.  Taking Coricidin HBP with very mild relief.  Multiple sick contacts recently.    Past Medical History:  Diagnosis Date   Hypertension    Migraine    Thyroid disease    Vertigo     Patient Active Problem List   Diagnosis Date Noted   CONSTIPATION 02/22/2010    Past Surgical History:  Procedure Laterality Date   CHOLECYSTECTOMY      OB History   No obstetric history on file.      Home Medications    Prior to Admission medications   Medication Sig Start Date End Date Taking? Authorizing Provider  amoxicillin-clavulanate (AUGMENTIN) 875-125 MG tablet Take 1 tablet by mouth every 12 (twelve) hours. 05/09/22  Yes Volney American, PA-C  fluticasone [facility]) 50 MCG/ACT nasal spray Place 1 spray into both nostrils 2 (two) times daily. 05/09/22  Yes Volney American, PA-C  ACETAMINOPHEN-BUTALBITAL 50-325 MG TABS Take 1 tablet by mouth 2 (two) times daily as needed. 02/19/10   [provider]  Aspirin-Acetaminophen-Caffeine (EXCEDRIN MIGRAINE PO) Take 1 tablet by mouth as needed (pain, headaches).    [provider]  cholecalciferol (VITAMIN D) 1000 units tablet Take 1,000 Units by mouth daily.    [provider]  diazepam (VALIUM) 10 MG tablet Take 1 tablet by mouth at bedtime as needed for sleep. 10/30/16   [provider]  escitalopram (LEXAPRO) 10 MG tablet Take 10 mg by mouth daily. 11/17/16   [provider]  estradiol (ESTRACE) 1 MG tablet Take 1 tablet by mouth daily. 11/20/16   [provider]   fluorouracil (EFUDEX) 5 % cream Apply topically 2 (two) times daily. Starting 04/09/2022 apply to right nasal bridge bid for 7 days 03/13/22   Ralene Bathe, MD  hydrochlorothiazide (HYDRODIURIL) 25 MG tablet Take 25 mg by mouth daily. 11/16/16   [provider]  Levothyroxine Sodium 75 MCG CAPS Take 75-100 mcg by mouth daily. 02/19/10   [provider]  lidocaine (LIDODERM) 5 % Place 1 patch onto the skin daily. Remove & Discard patch within 12 hours or as directed by MD 01/23/22   Dorie Rank, MD  medroxyPROGESTERone (PROVERA) 2.5 MG tablet Take 1 tablet by mouth daily. 11/20/16   [provider]  methocarbamol (ROBAXIN) 500 MG tablet Take 1 tablet (500 mg total) by mouth every 8 (eight) hours as needed for muscle spasms. 01/20/22   Godfrey Pick, MD  oxyCODONE-acetaminophen (PERCOCET/ROXICET) 5-325 MG tablet Take 1 tablet by mouth every 6 (six) hours as needed for severe pain. 01/23/22   Dorie Rank, MD    Family History History reviewed. No pertinent family history.  Social History Social History   Tobacco Use   Smoking status: Never   Smokeless tobacco: Never  Substance Use Topics   Alcohol use: No   Drug use: No     Allergies   Patient has no known allergies.   Review of Systems Review of Systems PER HPI  Physical Exam Triage Vital Signs ED Triage Vitals  Enc Vitals Group  BP 05/09/22 1041 137/87     Pulse Rate 05/09/22 1041 81     Resp 05/09/22 1041 18     Temp 05/09/22 1041 98.3 F (36.8 C)     Temp Source 05/09/22 1041 Oral     SpO2 05/09/22 1041 98 %     Weight --      Height --      Head Circumference --      Peak Flow --      Pain Score 05/09/22 1044 0     Pain Loc --      Pain Edu? --      Excl. in [place]? --    No data found.  Updated Vital Signs BP 137/87 (BP Location: Right Arm)   Pulse 81   Temp 98.3 F (36.8 C) (Oral)   Resp 18   SpO2 98%   Visual Acuity Right Eye Distance:   Left Eye Distance:   Bilateral  Distance:    Right Eye Near:   Left Eye Near:    Bilateral Near:     Physical Exam Vitals and nursing note reviewed.  Constitutional:      Appearance: Normal appearance.  HENT:     Head: Atraumatic.     Right Ear: Tympanic membrane and external ear normal.     Left Ear: Tympanic membrane and external ear normal.     Nose: Congestion present.     Mouth/Throat:     Mouth: Mucous membranes are moist.     Pharynx: Posterior oropharyngeal erythema present.  Eyes:     Extraocular Movements: Extraocular movements intact.     Conjunctiva/sclera: Conjunctivae normal.  Cardiovascular:     Rate and Rhythm: Normal rate and regular rhythm.     Heart sounds: Normal heart sounds.  Pulmonary:     Effort: Pulmonary effort is normal.     Breath sounds: Normal breath sounds. No wheezing or rales.  Musculoskeletal:        General: Normal range of motion.     Cervical back: Normal range of motion and neck supple.  Skin:    General: Skin is warm and dry.  Neurological:     Mental Status: She is alert and oriented to person, place, and time.  Psychiatric:        Mood and Affect: Mood normal.        Thought Content: Thought content normal.      UC Treatments / Results  Labs (all labs ordered are listed, but only abnormal results are displayed) Labs Reviewed - No data to display  EKG   Radiology No results found.  Procedures Procedures (including critical care time)  Medications Ordered in UC Medications - No data to display  Initial Impression / Assessment and Plan / UC Course  I have reviewed the triage vital signs and the nursing notes.  Pertinent labs & imaging results that were available during my care of the patient were reviewed by me and considered in my medical decision making (see chart for details).     Given duration and worsening course, treat with Augmentin, Flonase, supportive over-the-counter medications and home care.  Return for worsening symptoms.  Final  Clinical Impressions(s) / UC Diagnoses   Final diagnoses:  Acute maxillary sinusitis, recurrence not specified   Discharge Instructions   None    ED Prescriptions     Medication Sig Dispense Auth. Provider   fluticasone (FLONASE) 50 MCG/ACT nasal spray Place 1 spray into both nostrils 2 (two) times  daily. 16 g Volney American, [place]   amoxicillin-clavulanate (AUGMENTIN) 875-125 MG tablet Take 1 tablet by mouth every 12 (twelve) hours. 14 tablet Volney American, [place]      PDMP not reviewed this encounter.   Volney American, [place] 05/09/22 1113

## 2022-05-23 DIAGNOSIS — J069 Acute upper respiratory infection, unspecified: Secondary | ICD-10-CM | POA: Diagnosis not present

## 2022-06-19 ENCOUNTER — Ambulatory Visit: Payer: No Typology Code available for payment source | Admitting: Dermatology

## 2022-06-19 ENCOUNTER — Ambulatory Visit: Payer: Medicare Other | Admitting: Dermatology

## 2022-06-19 DIAGNOSIS — Z79899 Other long term (current) drug therapy: Secondary | ICD-10-CM

## 2022-06-19 DIAGNOSIS — L210 Seborrhea capitis: Secondary | ICD-10-CM

## 2022-06-19 DIAGNOSIS — L820 Inflamed seborrheic keratosis: Secondary | ICD-10-CM

## 2022-06-19 DIAGNOSIS — L578 Other skin changes due to chronic exposure to nonionizing radiation: Secondary | ICD-10-CM | POA: Diagnosis not present

## 2022-06-19 DIAGNOSIS — L570 Actinic keratosis: Secondary | ICD-10-CM

## 2022-06-19 DIAGNOSIS — Z5111 Encounter for antineoplastic chemotherapy: Secondary | ICD-10-CM | POA: Diagnosis not present

## 2022-06-19 NOTE — Patient Instructions (Addendum)
Start mid February  - Start 5-fluorouracil/calcipotriene cream twice a day for 7 days to affected areas including R nasal bridge. Prescription sent to [facility]. Patient advised they will receive an email to purchase the medication online and have it sent to their home. Patient provided with handout reviewing treatment course and side effects and advised to call or message [place] on MyChart with any concerns.   Reviewed course of treatment and expected reaction.  Patient advised to expect inflammation and crusting and advised that erosions are possible.  Patient advised to be diligent with sun protection during and after treatment. Counseled to keep medication out of reach of children and pets.    5-Fluorouracil/Calcipotriene Patient Education   Actinic keratoses are the dry, red scaly spots on the skin caused by sun damage. A portion of these spots can turn into skin cancer with time, and treating them can help prevent development of skin cancer.   Treatment of these spots requires removal of the defective skin cells. There are various ways to remove actinic keratoses, including freezing with liquid nitrogen, treatment with creams, or treatment with a blue light procedure in the office.   5-fluorouracil cream is a topical cream used to treat actinic keratoses. It works by interfering with the growth of abnormal fast-growing skin cells, such as actinic keratoses. These cells peel off and are replaced by healthy ones.   5-fluorouracil/calcipotriene is a combination of the 5-fluorouracil cream with a vitamin D analog cream called calcipotriene. The calcipotriene alone does not treat actinic keratoses. However, when it is combined with 5-fluorouracil, it helps the 5-fluorouracil treat the actinic keratoses much faster so that the same results can be achieved with a much shorter treatment time.  INSTRUCTIONS FOR 5-FLUOROURACIL/CALCIPOTRIENE CREAM:   5-fluorouracil/calcipotriene  cream typically only needs to be used for 4-7 days. A thin layer should be applied twice a day to the treatment areas recommended by your physician.   If your physician prescribed you separate tubes of 5-fluourouracil and calcipotriene, apply a thin layer of 5-fluorouracil followed by a thin layer of calcipotriene.   Avoid contact with your eyes, nostrils, and mouth. Do not use 5-fluorouracil/calcipotriene cream on infected or open wounds.   You will develop redness, irritation and some crusting at areas where you have pre-cancer damage/actinic keratoses. IF YOU DEVELOP PAIN, BLEEDING, OR SIGNIFICANT CRUSTING, STOP THE TREATMENT EARLY - you have already gotten a good response and the actinic keratoses should clear up well.  Wash your hands after applying 5-fluorouracil 5% cream on your skin.   A moisturizer or sunscreen with a minimum SPF 30 should be applied each morning.   Once you have finished the treatment, you can apply a thin layer of Vaseline twice a day to irritated areas to soothe and calm the areas more quickly. If you experience significant discomfort, contact your physician.  For some patients it is necessary to repeat the treatment for best results.  SIDE EFFECTS: When using 5-fluorouracil/calcipotriene cream, you may have mild irritation, such as redness, dryness, swelling, or a mild burning sensation. This usually resolves within 2 weeks. The more actinic keratoses you have, the more redness and inflammation you can expect during treatment. Eye irritation has been reported rarely. If this occurs, please let us know.  If you have any trouble using this cream, please call the office. If you have any other questions about this information, please do not hesitate to ask me before you leave the office.   Due to recent  changes in healthcare laws, you may see results of your pathology and/or laboratory studies on MyChart before the doctors have had a chance to review them. We understand  that in some cases there may be results that are confusing or concerning to you. Please understand that not all results are received at the same time and often the doctors may need to interpret multiple results in order to provide you with the best plan of care or course of treatment. Therefore, we ask that you please give [place] 2 business days to thoroughly review all your results before contacting the office for clarification. Should we see a critical lab result, you will be contacted sooner.   If You Need Anything After Your Visit  If you have any questions or concerns for your doctor, please call our main line at [phone] and press option 4 to reach your doctor's medical assistant. If no one answers, please leave a voicemail as directed and we will return your call as soon as possible. Messages left after 4 pm will be answered the following business day.   You may also send [place] a message via [place]. We typically respond to MyChart messages within 1-2 business days.  For prescription refills, please ask your pharmacy to contact our office. Our fax number is [phone].  If you have an urgent issue when the clinic is closed that cannot wait until the next business day, you can page your doctor at the number below.    Please note that while we do our best to be available for urgent issues outside of office hours, we are not available 24/7.   If you have an urgent issue and are unable to reach [place], you may choose to seek medical care at your doctor's office, retail clinic, urgent care center, or emergency room.  If you have a medical emergency, please immediately call 911 or go to the emergency department.  Pager Numbers  - Dr. Nehemiah Massed: [phone]  - Dr. Laurence Ferrari: [phone]  - Dr. Nicole Kindred: [phone]  In the event of inclement weather, please call our main line at [phone] for an update on the status of any delays or closures.  Dermatology Medication Tips: Please keep the  boxes that topical medications come in in order to help keep track of the instructions about where and how to use these. Pharmacies typically print the medication instructions only on the boxes and not directly on the medication tubes.   If your medication is too expensive, please contact our office at [phone] option 4 or send [place] a message through [place].   We are unable to tell what your co-pay for medications will be in advance as this is different depending on your insurance coverage. However, we may be able to find a substitute medication at lower cost or fill out paperwork to get insurance to cover a needed medication.   If a prior authorization is required to get your medication covered by your insurance company, please allow [place] 1-2 business days to complete this process.  Drug prices often vary depending on where the prescription is filled and some pharmacies may offer cheaper prices.  The website www.goodrx.com contains coupons for medications through different pharmacies. The prices here do not account for what the cost may be with help from insurance (it may be cheaper with your insurance), but the website can give you the price if you did not use any insurance.  - You can print the associated coupon and take it with your prescription  to the pharmacy.  - You may also stop by our office during regular business hours and pick up a GoodRx coupon card.  - If you need your prescription sent electronically to a different pharmacy, notify our office through [facility] or by phone at [phone] option 4.     Si Usted Necesita Algo Despus de Su Visita  Tambin puede enviarnos un mensaje a travs de Pharmacist, community. Por lo general respondemos a los mensajes de MyChart en el transcurso de 1 a 2 das hbiles.  Para renovar recetas, por favor pida a su farmacia que se ponga en contacto con nuestra oficina. Harland Dingwall de fax es [place] [phone].  Si tiene un asunto urgente cuando la  clnica est cerrada y que no puede esperar hasta el siguiente da hbil, puede llamar/localizar a su doctor(a) al nmero que aparece a continuacin.   Por favor, tenga en cuenta que aunque hacemos todo lo posible para estar disponibles para asuntos urgentes fuera del horario de [place], no estamos disponibles las 24 horas del da, los 7 das de la [place].   Si tiene un problema urgente y no puede comunicarse con nosotros, puede optar por buscar atencin mdica  en el consultorio de su doctor(a), en una clnica privada, en un centro de atencin urgente o en una sala de emergencias.  Si tiene Engineering geologist, por favor llame inmediatamente al 911 o vaya a la sala de emergencias.  Nmeros de bper  - Dr. Nehemiah Massed: [phone]  - Dra. Moye: [phone]  - Dra. Nicole Kindred: [phone]  En caso de inclemencias del [place], por favor llame a Johnsie Kindred principal al [phone] para una actualizacin sobre el [place] de cualquier retraso o cierre.  Consejos para la medicacin en dermatologa: Por favor, guarde las cajas en las que vienen los medicamentos de uso tpico para ayudarle a seguir las instrucciones sobre dnde y cmo usarlos. Las farmacias generalmente imprimen las instrucciones del medicamento slo en las cajas y no directamente en los tubos del [place].   Si su medicamento es muy caro, por favor, pngase en contacto con Zigmund Daniel llamando al [phone] y presione la opcin 4 o envenos un mensaje a travs de Pharmacist, community.   No podemos decirle cul ser su copago por los medicamentos por adelantado ya que esto es diferente dependiendo de la cobertura de su seguro. Sin embargo, es posible que podamos encontrar un medicamento sustituto a Electrical engineer un formulario para que el seguro cubra el medicamento que se considera necesario.   Si se requiere una autorizacin previa para que su compaa de seguros [place] su medicamento, por favor permtanos de 1 a 2 das hbiles  para completar este proceso.  Los precios de los medicamentos varan con frecuencia dependiendo del Environmental consultant de dnde se surte la receta y alguna farmacias pueden ofrecer precios ms baratos.  El sitio web www.goodrx.com tiene cupones para medicamentos de Airline pilot. Los precios aqu no tienen en cuenta lo que podra costar con la ayuda del seguro (puede ser ms barato con su seguro), pero el sitio web puede darle el precio si no utiliz Research scientist (physical sciences).  - Puede imprimir el cupn correspondiente y llevarlo con su receta a la farmacia.  - Tambin puede pasar por nuestra oficina durante el horario de atencin regular y Charity fundraiser una tarjeta de cupones de GoodRx.  - Si necesita que su receta se enve electrnicamente a Chiropodist, informe a nuestra oficina a travs de MyChart de [employer] o por  telfono llamando al [phone] y presione la opcin 4.

## 2022-06-19 NOTE — Progress Notes (Signed)
Follow-Up Visit   Subjective  Shelly Wood is a 67 y.o. female who presents for the following: Follow-up (Patient here for 3 month follow up on ak treated at right nasal bridge with cryotherapy and 5/fu cream at area. Patient reports getting very red and inflamed at area. /Patient also here for follow up for a spot treated with cryotherapy at left dorsal hand. Patient reports some new spots at right forearm she would like checked. ). The patient has spots, moles and lesions to be evaluated, some may be new or changing and the patient has concerns that these could be cancer.  The following portions of the chart were reviewed this encounter and updated as appropriate:  Tobacco  Allergies  Meds  Problems  Med Hx  Surg Hx  Fam Hx     Review of Systems: No other skin or systemic complaints except as noted in HPI or Assessment and Plan.  Objective  Well appearing patient in no apparent distress; mood and affect are within normal limits.  A focused examination was performed including right nasal bridge, right forearm and left dorsal hand . Relevant physical exam findings are noted in the Assessment and Plan.  right nasal bridge x 1 Erythematous thin papules/macules with gritty scale.   right forearm x 2 (2) Erythematous stuck-on, waxy papule or plaque   Assessment & Plan  Actinic keratosis - minimal residual right nasal bridge x 1  Start mid February  - Start 5-fluorouracil/calcipotriene cream twice a day for 7 days to affected areas including R nasal bridge.    Reviewed course of treatment and expected reaction.  Patient advised to expect inflammation and crusting and advised that erosions are possible.  Patient advised to be diligent with sun protection during and after treatment. Counseled to keep medication out of reach of children and pets.   Destruction of lesion - right nasal bridge x 1 Complexity: simple   Destruction method: cryotherapy   Informed consent: discussed  and consent obtained   Timeout:  patient name, date of birth, surgical site, and procedure verified Lesion destroyed using liquid nitrogen: Yes   Region frozen until ice ball extended beyond lesion: Yes   Outcome: patient tolerated procedure well with no complications   Post-procedure details: wound care instructions given   Additional details:  Prior to procedure, discussed risks of blister formation, small wound, skin dyspigmentation, or rare scar following cryotherapy. Recommend Vaseline ointment to treated areas while healing.  Actinic Damage with PreCancerous Actinic Keratoses Counseling for Topical Chemotherapy Management: Patient exhibits: - Severe, confluent actinic changes with pre-cancerous actinic keratoses that is secondary to cumulative UV radiation exposure over time - Condition that is severe; chronic, not at goal. - diffuse scaly erythematous macules and papules with underlying dyspigmentation - Discussed Prescription "Field Treatment" topical Chemotherapy for Severe, Chronic Confluent Actinic Changes with Pre-Cancerous Actinic Keratoses Field treatment involves treatment of an entire area of skin that has confluent Actinic Changes (Sun/ Ultraviolet light damage) and PreCancerous Actinic Keratoses by method of PhotoDynamic Therapy (PDT) and/or prescription Topical Chemotherapy agents such as 5-fluorouracil, 5-fluorouracil/calcipotriene, and/or imiquimod.  The purpose is to decrease the number of clinically evident and subclinical PreCancerous lesions to prevent progression to development of skin cancer by chemically destroying early precancer changes that may or may not be visible.  It has been shown to reduce the risk of developing skin cancer in the treated area. As a result of treatment, redness, scaling, crusting, and open sores may occur during treatment course.  One or more than one of these methods may be used and may have to be used several times to control, suppress and  eliminate the PreCancerous changes. Discussed treatment course, expected reaction, and possible side effects. - Recommend daily broad spectrum sunscreen SPF 30+ to sun-exposed areas, reapply every 2 hours as needed.  - Staying in the shade or wearing long sleeves, sun glasses (UVA+UVB protection) and wide brim hats (4-inch brim around the entire circumference of the hat) are also recommended. - Call for new or changing lesions.  Inflamed seborrheic keratosis (2) right forearm x 2 Symptomatic, irritating, patient would like treated. Destruction of lesion - right forearm x 2 Complexity: simple   Destruction method: cryotherapy   Informed consent: discussed and consent obtained   Timeout:  patient name, date of birth, surgical site, and procedure verified Lesion destroyed using liquid nitrogen: Yes   Region frozen until ice ball extended beyond lesion: Yes   Outcome: patient tolerated procedure well with no complications   Post-procedure details: wound care instructions given   Additional details:  Prior to procedure, discussed risks of blister formation, small wound, skin dyspigmentation, or rare scar following cryotherapy. Recommend Vaseline ointment to treated areas while healing.  Return in about 3 months (around 09/18/2022) for ak follow up. IRuthell Rummage, CMA, am acting as scribe for Sarina Ser, MD. Documentation: I have reviewed the above documentation for accuracy and completeness, and I agree with the above.  Sarina Ser, MD

## 2022-06-20 ENCOUNTER — Encounter: Payer: Self-pay | Admitting: Dermatology

## 2022-07-25 DIAGNOSIS — Z1231 Encounter for screening mammogram for malignant neoplasm of breast: Secondary | ICD-10-CM | POA: Diagnosis not present

## 2022-07-25 DIAGNOSIS — Z01419 Encounter for gynecological examination (general) (routine) without abnormal findings: Secondary | ICD-10-CM | POA: Diagnosis not present

## 2022-07-25 DIAGNOSIS — Z6829 Body mass index (BMI) 29.0-29.9, adult: Secondary | ICD-10-CM | POA: Diagnosis not present

## 2022-07-29 DIAGNOSIS — M5114 Intervertebral disc disorders with radiculopathy, thoracic region: Secondary | ICD-10-CM | POA: Diagnosis not present

## 2022-07-29 DIAGNOSIS — I700 Atherosclerosis of aorta: Secondary | ICD-10-CM | POA: Diagnosis not present

## 2022-07-29 DIAGNOSIS — J0100 Acute maxillary sinusitis, unspecified: Secondary | ICD-10-CM | POA: Diagnosis not present

## 2022-07-29 DIAGNOSIS — M47814 Spondylosis without myelopathy or radiculopathy, thoracic region: Secondary | ICD-10-CM | POA: Diagnosis not present

## 2022-07-29 DIAGNOSIS — I10 Essential (primary) hypertension: Secondary | ICD-10-CM | POA: Diagnosis not present

## 2022-07-29 DIAGNOSIS — E663 Overweight: Secondary | ICD-10-CM | POA: Diagnosis not present

## 2022-07-29 DIAGNOSIS — Z6829 Body mass index (BMI) 29.0-29.9, adult: Secondary | ICD-10-CM | POA: Diagnosis not present

## 2022-07-29 DIAGNOSIS — M1991 Primary osteoarthritis, unspecified site: Secondary | ICD-10-CM | POA: Diagnosis not present

## 2022-09-19 DIAGNOSIS — Z6828 Body mass index (BMI) 28.0-28.9, adult: Secondary | ICD-10-CM | POA: Diagnosis not present

## 2022-09-19 DIAGNOSIS — E663 Overweight: Secondary | ICD-10-CM | POA: Diagnosis not present

## 2022-09-19 DIAGNOSIS — J069 Acute upper respiratory infection, unspecified: Secondary | ICD-10-CM | POA: Diagnosis not present

## 2022-09-25 DIAGNOSIS — M7061 Trochanteric bursitis, right hip: Secondary | ICD-10-CM | POA: Diagnosis not present

## 2022-09-30 ENCOUNTER — Ambulatory Visit (INDEPENDENT_AMBULATORY_CARE_PROVIDER_SITE_OTHER): Payer: No Typology Code available for payment source | Admitting: Dermatology

## 2022-09-30 VITALS — BP 126/85

## 2022-09-30 DIAGNOSIS — Z872 Personal history of diseases of the skin and subcutaneous tissue: Secondary | ICD-10-CM

## 2022-09-30 DIAGNOSIS — I781 Nevus, non-neoplastic: Secondary | ICD-10-CM

## 2022-09-30 DIAGNOSIS — L738 Other specified follicular disorders: Secondary | ICD-10-CM

## 2022-09-30 DIAGNOSIS — L578 Other skin changes due to chronic exposure to nonionizing radiation: Secondary | ICD-10-CM | POA: Diagnosis not present

## 2022-09-30 NOTE — Progress Notes (Signed)
   Follow-Up Visit   Subjective  Shelly Wood is a 67 y.o. female who presents for the following: AK follow up of right nasal bridge treated with LN2 and 5FU/Calcipotriene cream The patient has spots, moles and lesions to be evaluated, some may be new or changing and the patient may have concern these could be cancer.  The following portions of the chart were reviewed this encounter and updated as appropriate: medications, allergies, medical history  Review of Systems:  No other skin or systemic complaints except as noted in HPI or Assessment and Plan.  Objective  Well appearing patient in no apparent distress; mood and affect are within normal limits. A focused examination was performed of the following areas: Face Relevant exam findings are noted in the Assessment and Plan.   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 - these may recur and new lesions may form requiring treatment to prevent transformation into skin cancer - observe for new or changing spots and contact [facility] for appointment if occur - photoprotection with sun protective clothing; sunglasses and broad spectrum sunscreen with SPF of at least 30 + and frequent self skin exams recommended - yearly exams by a dermatologist recommended for persons with history of PreCancerous Actinic Keratoses  ACTINIC DAMAGE - chronic, secondary to cumulative UV radiation exposure/sun exposure over time - diffuse scaly erythematous macules with underlying dyspigmentation - Recommend daily broad spectrum sunscreen SPF 30+ to sun-exposed areas, reapply every 2 hours as needed.  - Recommend staying in the shade or wearing long sleeves, sun glasses (UVA+UVB protection) and wide brim hats (4-inch brim around the entire circumference of the hat). - Call for new or changing lesions.  Sebaceous Hyperplasia of left cheek - Small  yellow papules with a central dell - Benign-appearing - Observe. Call for changes.   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  I, Joanie Coddington, CMA, am acting as scribe for Armida Sans, MD .  Documentation: I have reviewed the above documentation for accuracy and completeness, and I agree with the above.  Armida Sans, MD

## 2022-09-30 NOTE — Patient Instructions (Signed)
Due to recent changes in healthcare laws, you may see results of your pathology and/or laboratory studies on MyChart before the doctors have had a chance to review them. We understand that in some cases there may be results that are confusing or concerning to you. Please understand that not all results are received at the same time and often the doctors may need to interpret multiple results in order to provide you with the best plan of care or course of treatment. Therefore, we ask that you please give us 2 business days to thoroughly review all your results before contacting the office for clarification. Should we see a critical lab result, you will be contacted sooner.   If You Need Anything After Your Visit  If you have any questions or concerns for your doctor, please call our main line at [phone] and press option 4 to reach your doctor's medical assistant. If no one answers, please leave a voicemail as directed and we will return your call as soon as possible. Messages left after 4 pm will be answered the following business day.   You may also send us a message via MyChart. We typically respond to MyChart messages within 1-2 business days.  For prescription refills, please ask your pharmacy to contact our office. Our fax number is [phone].  If you have an urgent issue when the clinic is closed that cannot wait until the next business day, you can page your doctor at the number below.    Please note that while we do our best to be available for urgent issues outside of office hours, we are not available 24/7.   If you have an urgent issue and are unable to reach us, you may choose to seek medical care at your doctor's office, retail clinic, urgent care center, or emergency room.  If you have a medical emergency, please immediately call 911 or go to the emergency department.  Pager Numbers  - Dr. Kowalski: [phone]  - Dr. Moye: [phone]  - Dr. Stewart:  [phone]  In the event of inclement weather, please call our main line at [phone] for an update on the status of any delays or closures.  Dermatology Medication Tips: Please keep the boxes that topical medications come in in order to help keep track of the instructions about where and how to use these. Pharmacies typically print the medication instructions only on the boxes and not directly on the medication tubes.   If your medication is too expensive, please contact our office at [phone] option 4 or send us a message through MyChart.   We are unable to tell what your co-pay for medications will be in advance as this is different depending on your insurance coverage. However, we may be able to find a substitute medication at lower cost or fill out paperwork to get insurance to cover a needed medication.   If a prior authorization is required to get your medication covered by your insurance company, please allow us 1-2 business days to complete this process.  Drug prices often vary depending on where the prescription is filled and some pharmacies may offer cheaper prices.  The website www.goodrx.com contains coupons for medications through different pharmacies. The prices here do not account for what the cost may be with help from insurance (it may be cheaper with your insurance), but the website can give you the price if you did not use any insurance.  - You can print the associated coupon and take it with   your prescription to the pharmacy.  - You may also stop by our office during regular business hours and pick up a GoodRx coupon card.  - If you need your prescription sent electronically to a different pharmacy, notify our office through [facility] MyChart or by phone at [phone] option 4.     Si Usted Necesita Algo Despus de Su Visita  Tambin puede enviarnos un mensaje a travs de MyChart. Por lo general respondemos a los mensajes de MyChart en el transcurso de 1 a 2  das hbiles.  Para renovar recetas, por favor pida a su farmacia que se ponga en contacto con nuestra oficina. Nuestro nmero de fax es el [phone].  Si tiene un asunto urgente cuando la clnica est cerrada y que no puede esperar hasta el siguiente da hbil, puede llamar/localizar a su doctor(a) al nmero que aparece a continuacin.   Por favor, tenga en cuenta que aunque hacemos todo lo posible para estar disponibles para asuntos urgentes fuera del horario de oficina, no estamos disponibles las 24 horas del da, los 7 das de la semana.   Si tiene un problema urgente y no puede comunicarse con nosotros, puede optar por buscar atencin mdica  en el consultorio de su doctor(a), en una clnica privada, en un centro de atencin urgente o en una sala de emergencias.  Si tiene una emergencia mdica, por favor llame inmediatamente al 911 o vaya a la sala de emergencias.  Nmeros de bper  - Dr. Kowalski: [phone]  - Dra. Moye: [phone]  - Dra. Stewart: [phone]  En caso de inclemencias del tiempo, por favor llame a nuestra lnea principal al [phone] para una actualizacin sobre el estado de cualquier retraso o cierre.  Consejos para la medicacin en dermatologa: Por favor, guarde las cajas en las que vienen los medicamentos de uso tpico para ayudarle a seguir las instrucciones sobre dnde y cmo usarlos. Las farmacias generalmente imprimen las instrucciones del medicamento slo en las cajas y no directamente en los tubos del medicamento.   Si su medicamento es muy caro, por favor, pngase en contacto con nuestra oficina llamando al [phone] y presione la opcin 4 o envenos un mensaje a travs de MyChart.   No podemos decirle cul ser su copago por los medicamentos por adelantado ya que esto es diferente dependiendo de la cobertura de su seguro. Sin embargo, es posible que podamos encontrar un medicamento sustituto a menor costo o llenar un formulario para que el  seguro cubra el medicamento que se considera necesario.   Si se requiere una autorizacin previa para que su compaa de seguros cubra su medicamento, por favor permtanos de 1 a 2 das hbiles para completar este proceso.  Los precios de los medicamentos varan con frecuencia dependiendo del lugar de dnde se surte la receta y alguna farmacias pueden ofrecer precios ms baratos.  El sitio web www.goodrx.com tiene cupones para medicamentos de diferentes farmacias. Los precios aqu no tienen en cuenta lo que podra costar con la ayuda del seguro (puede ser ms barato con su seguro), pero el sitio web puede darle el precio si no utiliz ningn seguro.  - Puede imprimir el cupn correspondiente y llevarlo con su receta a la farmacia.  - Tambin puede pasar por nuestra oficina durante el horario de atencin regular y recoger una tarjeta de cupones de GoodRx.  - Si necesita que su receta se enve electrnicamente a una farmacia diferente, informe a nuestra oficina a travs de MyChart de [place]   o por telfono llamando al [phone] y presione la opcin 4.  

## 2022-10-07 ENCOUNTER — Encounter: Payer: Self-pay | Admitting: Dermatology

## 2022-11-05 ENCOUNTER — Ambulatory Visit: Payer: Medicare Other | Admitting: Dermatology

## 2023-01-02 DIAGNOSIS — E559 Vitamin D deficiency, unspecified: Secondary | ICD-10-CM | POA: Diagnosis not present

## 2023-01-02 DIAGNOSIS — Z1331 Encounter for screening for depression: Secondary | ICD-10-CM | POA: Diagnosis not present

## 2023-01-02 DIAGNOSIS — M5414 Radiculopathy, thoracic region: Secondary | ICD-10-CM | POA: Diagnosis not present

## 2023-01-02 DIAGNOSIS — M1991 Primary osteoarthritis, unspecified site: Secondary | ICD-10-CM | POA: Diagnosis not present

## 2023-01-02 DIAGNOSIS — I10 Essential (primary) hypertension: Secondary | ICD-10-CM | POA: Diagnosis not present

## 2023-01-02 DIAGNOSIS — M5114 Intervertebral disc disorders with radiculopathy, thoracic region: Secondary | ICD-10-CM | POA: Diagnosis not present

## 2023-01-02 DIAGNOSIS — I700 Atherosclerosis of aorta: Secondary | ICD-10-CM | POA: Diagnosis not present

## 2023-01-02 DIAGNOSIS — E663 Overweight: Secondary | ICD-10-CM | POA: Diagnosis not present

## 2023-01-02 DIAGNOSIS — Z6827 Body mass index (BMI) 27.0-27.9, adult: Secondary | ICD-10-CM | POA: Diagnosis not present

## 2023-01-02 DIAGNOSIS — E039 Hypothyroidism, unspecified: Secondary | ICD-10-CM | POA: Diagnosis not present

## 2023-01-02 DIAGNOSIS — Z0001 Encounter for general adult medical examination with abnormal findings: Secondary | ICD-10-CM | POA: Diagnosis not present

## 2023-01-02 DIAGNOSIS — M47814 Spondylosis without myelopathy or radiculopathy, thoracic region: Secondary | ICD-10-CM | POA: Diagnosis not present

## 2023-03-17 ENCOUNTER — Encounter: Payer: Self-pay | Admitting: Dermatology

## 2023-03-17 ENCOUNTER — Ambulatory Visit (INDEPENDENT_AMBULATORY_CARE_PROVIDER_SITE_OTHER): Payer: No Typology Code available for payment source | Admitting: Dermatology

## 2023-03-17 VITALS — BP 128/81 | HR 73

## 2023-03-17 DIAGNOSIS — Z7189 Other specified counseling: Secondary | ICD-10-CM

## 2023-03-17 DIAGNOSIS — L814 Other melanin hyperpigmentation: Secondary | ICD-10-CM | POA: Diagnosis not present

## 2023-03-17 DIAGNOSIS — L821 Other seborrheic keratosis: Secondary | ICD-10-CM | POA: Diagnosis not present

## 2023-03-17 DIAGNOSIS — I781 Nevus, non-neoplastic: Secondary | ICD-10-CM

## 2023-03-17 DIAGNOSIS — L570 Actinic keratosis: Secondary | ICD-10-CM

## 2023-03-17 DIAGNOSIS — W908XXA Exposure to other nonionizing radiation, initial encounter: Secondary | ICD-10-CM

## 2023-03-17 DIAGNOSIS — L578 Other skin changes due to chronic exposure to nonionizing radiation: Secondary | ICD-10-CM | POA: Diagnosis not present

## 2023-03-17 DIAGNOSIS — I8393 Asymptomatic varicose veins of bilateral lower extremities: Secondary | ICD-10-CM | POA: Diagnosis not present

## 2023-03-17 DIAGNOSIS — L719 Rosacea, unspecified: Secondary | ICD-10-CM

## 2023-03-17 DIAGNOSIS — L820 Inflamed seborrheic keratosis: Secondary | ICD-10-CM

## 2023-03-17 NOTE — Progress Notes (Signed)
Follow-Up Visit   Subjective  Shelly Wood is a 68 y.o. female who presents for the following: spot at forehead she noticed few weeks ago. Rough area.   The patient has spots, moles and lesions to be evaluated, some may be new or changing and the patient may have concern these could be cancer.   The following portions of the chart were reviewed this encounter and updated as appropriate: medications, allergies, medical history  Review of Systems:  No other skin or systemic complaints except as noted in HPI or Assessment and Plan.  Objective  Well appearing patient in no apparent distress; mood and affect are within normal limits.   A focused examination was performed of the following areas: Forehead, face, left lower leg,   Relevant exam findings are noted in the Assessment and Plan.  forehead x 1 Erythematous thin papules/macules with gritty scale.   Right Lower Leg - Anterior x 1 Erythematous stuck-on, waxy papule or plaque    Assessment & Plan   SEBORRHEIC KERATOSIS  - Stuck-on, waxy, tan-brown papules and/or plaques  - Benign-appearing - Discussed benign etiology and prognosis. - Observe - Call for any changes  ROSACEA mild with telangectasia  Exam Mid face erythema with telangiectasias   Rosacea is a chronic progressive skin condition usually affecting the face of adults, causing redness and/or acne bumps. It is treatable but not curable. It sometimes affects the eyes (ocular rosacea) as well. It may respond to topical and/or systemic medication and can flare with stress, sun exposure, alcohol, exercise, topical steroids (including hydrocortisone/cortisone 10) and some foods.  Daily application of broad spectrum spf 30+ sunscreen to face is recommended to reduce flares.  Patient denies grittiness of the eyes  Treatment Plan Counseling for BBL / IPL / Laser and Coordination of Care Discussed the treatment option of Broad Band Light (BBL) /Intense Pulsed Light  (IPL)/ Laser for skin discoloration, including brown spots and redness.  Typically we recommend at least 1-3 treatment sessions about 5-8 weeks apart for best results.  Cannot have tanned skin when BBL performed, and regular use of sunscreen/photoprotection is advised after the procedure to help maintain results. The patient's condition may also require "maintenance treatments" in the future.  The fee for BBL / laser treatments is $350 per treatment session for the whole face.  A fee can be quoted for other parts of the body.  Insurance typically does not pay for BBL/laser treatments and therefore the fee is an out-of-pocket cost. Recommend prophylactic valtrex treatment. Once scheduled for procedure, will send Rx in prior to patient's appointment.    LENTIGINES Exam: scattered tan macules Due to sun exposure Treatment Plan: Benign-appearing, observe. Recommend daily broad spectrum sunscreen SPF 30+ to sun-exposed areas, reapply every 2 hours as needed.  Call for any changes  Varicose Veins/Spider Veins - Dilated blue, purple or red veins at the lower extremities - Reassured - Smaller vessels can be treated by sclerotherapy (a procedure to inject a medicine into the veins to make them disappear) if desired, but the treatment is not covered by insurance. Larger vessels may be covered if symptomatic and we would refer to vascular surgeon if treatment desired.  Actinic keratosis R forehead x 1  Actinic keratoses are precancerous spots that appear secondary to cumulative UV radiation exposure/sun exposure over time. They are chronic with expected duration over 1 year. A portion of actinic keratoses will progress to squamous cell carcinoma of the skin. It is not possible to reliably  predict which spots will progress to skin cancer and so treatment is recommended to prevent development of skin cancer.  Recommend daily broad spectrum sunscreen SPF 30+ to sun-exposed areas, reapply every 2 hours as  needed.  Recommend staying in the shade or wearing long sleeves, sun glasses (UVA+UVB protection) and wide brim hats (4-inch brim around the entire circumference of the hat). Call for new or changing lesions.  Destruction of lesion - forehead x 1 Complexity: simple   Destruction method: cryotherapy   Informed consent: discussed and consent obtained   Timeout:  patient name, date of birth, surgical site, and procedure verified Lesion destroyed using liquid nitrogen: Yes   Region frozen until ice ball extended beyond lesion: Yes   Outcome: patient tolerated procedure well with no complications   Post-procedure details: wound care instructions given    Inflamed seborrheic keratosis Right Lower Leg - Anterior x 1  Symptomatic, irritating, patient would like treated.  Destruction of lesion - Right Lower Leg - Anterior x 1 Complexity: simple   Destruction method: cryotherapy   Informed consent: discussed and consent obtained   Timeout:  patient name, date of birth, surgical site, and procedure verified Lesion destroyed using liquid nitrogen: Yes   Region frozen until ice ball extended beyond lesion: Yes   Outcome: patient tolerated procedure well with no complications   Post-procedure details: wound care instructions given    ACTINIC DAMAGE - chronic, secondary to cumulative UV radiation exposure/sun exposure over time - diffuse scaly erythematous macules with underlying dyspigmentation - Recommend daily broad spectrum sunscreen SPF 30+ to sun-exposed areas, reapply every 2 hours as needed.  - Recommend staying in the shade or wearing long sleeves, sun glasses (UVA+UVB protection) and wide brim hats (4-inch brim around the entire circumference of the hat). - Call for new or changing lesions.   Return for keep follow up as scheduled .  IAsher Muir, CMA, am acting as scribe for Armida Sans, MD.   Documentation: I have reviewed the above documentation for accuracy and  completeness, and I agree with the above.  Armida Sans, MD

## 2023-03-17 NOTE — Patient Instructions (Addendum)
Actinic keratoses are precancerous spots that appear secondary to cumulative UV radiation exposure/sun exposure over time. They are chronic with expected duration over 1 year. A portion of actinic keratoses will progress to squamous cell carcinoma of the skin. It is not possible to reliably predict which spots will progress to skin cancer and so treatment is recommended to prevent development of skin cancer.  Recommend daily broad spectrum sunscreen SPF 30+ to sun-exposed areas, reapply every 2 hours as needed.  Recommend staying in the shade or wearing long sleeves, sun glasses (UVA+UVB protection) and wide brim hats (4-inch brim around the entire circumference of the hat). Call for new or changing lesions.     Cryotherapy Aftercare  Wash gently with soap and water everyday.   Apply Vaseline and Band-Aid daily until healed.     Seborrheic Keratosis  What causes seborrheic keratoses? Seborrheic keratoses are harmless, common skin growths that first appear during adult life.  As time goes by, more growths appear.  Some people may develop a large number of them.  Seborrheic keratoses appear on both covered and uncovered body parts.  They are not caused by sunlight.  The tendency to develop seborrheic keratoses can be inherited.  They vary in color from skin-colored to gray, brown, or even black.  They can be either smooth or have a rough, warty surface.   Seborrheic keratoses are superficial and look as if they were stuck on the skin.  Under the microscope this type of keratosis looks like layers upon layers of skin.  That is why at times the top layer may seem to fall off, but the rest of the growth remains and re-grows.    Treatment Seborrheic keratoses do not need to be treated, but can easily be removed in the office.  Seborrheic keratoses often cause symptoms when they rub on clothing or jewelry.  Lesions can be in the way of shaving.  If they become inflamed, they can cause itching,  soreness, or burning.  Removal of a seborrheic keratosis can be accomplished by freezing, burning, or surgery. If any spot bleeds, scabs, or grows rapidly, please return to have it checked, as these can be an indication of a skin cancer.    Due to recent changes in healthcare laws, you may see results of your pathology and/or laboratory studies on MyChart before the doctors have had a chance to review them. We understand that in some cases there may be results that are confusing or concerning to you. Please understand that not all results are received at the same time and often the doctors may need to interpret multiple results in order to provide you with the best plan of care or course of treatment. Therefore, we ask that you please give [place] 2 business days to thoroughly review all your results before contacting the office for clarification. Should we see a critical lab result, you will be contacted sooner.   If You Need Anything After Your Visit  If you have any questions or concerns for your doctor, please call our main line at [phone] and press option 4 to reach your doctor's medical assistant. If no one answers, please leave a voicemail as directed and we will return your call as soon as possible. Messages left after 4 pm will be answered the following business day.   You may also send [place] a message via MyChart. We typically respond to MyChart messages within 1-2 business days.  For prescription refills, please ask your pharmacy to contact  our office. Our fax number is [phone].  If you have an urgent issue when the clinic is closed that cannot wait until the next business day, you can page your doctor at the number below.    Please note that while we do our best to be available for urgent issues outside of office hours, we are not available 24/7.   If you have an urgent issue and are unable to reach [place], you may choose to seek medical care at your doctor's office, retail clinic,  urgent care center, or emergency room.  If you have a medical emergency, please immediately call 911 or go to the emergency department.  Pager Numbers  - Dr. Gwen Pounds: [phone]  - Dr. Roseanne Reno: [phone]  - Dr. Katrinka Blazing: [phone]   In the event of inclement weather, please call our main line at [phone] for an update on the status of any delays or closures.  Dermatology Medication Tips: Please keep the boxes that topical medications come in in order to help keep track of the instructions about where and how to use these. Pharmacies typically print the medication instructions only on the boxes and not directly on the medication tubes.   If your medication is too expensive, please contact our office at [phone] option 4 or send [place] a message through MyChart.   We are unable to tell what your co-pay for medications will be in advance as this is different depending on your insurance coverage. However, we may be able to find a substitute medication at lower cost or fill out paperwork to get insurance to cover a needed medication.   If a prior authorization is required to get your medication covered by your insurance company, please allow [place] 1-2 business days to complete this process.  Drug prices often vary depending on where the prescription is filled and some pharmacies may offer cheaper prices.  The website www.goodrx.com contains coupons for medications through different pharmacies. The prices here do not account for what the cost may be with help from insurance (it may be cheaper with your insurance), but the website can give you the price if you did not use any insurance.  - You can print the associated coupon and take it with your prescription to the pharmacy.  - You may also stop by our office during regular business hours and pick up a GoodRx coupon card.  - If you need your prescription sent electronically to a different pharmacy, notify our office through [facility] or by phone at [phone] option 4.     Si Usted Necesita Algo Despus de Su Visita  Tambin puede enviarnos un mensaje a travs de Clinical cytogeneticist. Por lo general respondemos a los mensajes de MyChart en el transcurso de 1 a 2 das hbiles.  Para renovar recetas, por favor pida a su farmacia que se ponga en contacto con nuestra oficina. Annie Sable de fax es [place] [phone].  Si tiene un asunto urgente cuando la clnica est cerrada y que no puede esperar hasta el siguiente da hbil, puede llamar/localizar a su doctor(a) al nmero que aparece a continuacin.   Por favor, tenga en cuenta que aunque hacemos todo lo posible para estar disponibles para asuntos urgentes fuera del horario de [place], no estamos disponibles las 24 horas del da, los 7 [address]  [address] de la [place].   Si tiene un problema urgente y no puede comunicarse con nosotros, puede optar por buscar atencin mdica  en el consultorio de su doctor(a), en una clnica  privada, en un centro de atencin urgente o en una sala de emergencias.  Si tiene Engineer, drilling, por favor llame inmediatamente al 911 o vaya a la sala de emergencias.  Nmeros de bper  - Dr. Gwen Pounds: [phone]  - Dra. Roseanne Reno: [phone]  - Dr. Katrinka Blazing: [phone]   En caso de inclemencias del tiempo, por favor llame a Lacy Duverney principal al [phone] para una actualizacin sobre el [place] de cualquier retraso o cierre.  Consejos para la medicacin en dermatologa: Por favor, guarde las cajas en las que vienen los medicamentos de uso tpico para ayudarle a seguir las instrucciones sobre dnde y cmo usarlos. Las farmacias generalmente imprimen las instrucciones del medicamento slo en las cajas y no directamente en los tubos del [place].   Si su medicamento es muy caro, por favor, pngase en contacto con Rolm [place]ala llamando al [phone] y presione la opcin 4 o envenos un mensaje a travs de Clinical cytogeneticist.   No podemos decirle cul  ser su copago por los medicamentos por adelantado ya que esto es diferente dependiendo de la cobertura de su seguro. Sin embargo, es posible que podamos encontrar un medicamento sustituto a Audiological scientist un formulario para que el seguro cubra el medicamento que se considera necesario.   Si se requiere una autorizacin previa para que su compaa de seguros [place] su medicamento, por favor permtanos de 1 a 2 das hbiles para completar [address]  Los precios de los medicamentos varan con frecuencia dependiendo del Environmental consultant de dnde se surte la receta y alguna farmacias pueden ofrecer precios ms baratos.  El sitio web www.goodrx.com tiene cupones para medicamentos de Health and safety inspector. Los precios aqu no tienen en cuenta lo que podra costar con la ayuda del seguro (puede ser ms barato con su seguro), pero el sitio web puede darle el precio si no utiliz Tourist information centre manager.  - Puede imprimir el cupn correspondiente y llevarlo con su receta a la farmacia.  - Tambin puede pasar por nuestra oficina durante el horario de atencin regular y Education officer, museum una tarjeta de cupones de GoodRx.  - Si necesita que su receta se enve electrnicamente a una farmacia diferente, informe a nuestra oficina a travs de MyChart de [place] o por telfono llamando al [phone] y presione la opcin 4.

## 2023-05-11 DIAGNOSIS — H25042 Posterior subcapsular polar age-related cataract, left eye: Secondary | ICD-10-CM | POA: Diagnosis not present

## 2023-05-11 DIAGNOSIS — H2513 Age-related nuclear cataract, bilateral: Secondary | ICD-10-CM | POA: Diagnosis not present

## 2023-05-11 DIAGNOSIS — H35341 Macular cyst, hole, or pseudohole, right eye: Secondary | ICD-10-CM | POA: Diagnosis not present

## 2023-05-14 DIAGNOSIS — M7062 Trochanteric bursitis, left hip: Secondary | ICD-10-CM | POA: Diagnosis not present

## 2023-07-11 DIAGNOSIS — E663 Overweight: Secondary | ICD-10-CM | POA: Diagnosis not present

## 2023-07-11 DIAGNOSIS — J019 Acute sinusitis, unspecified: Secondary | ICD-10-CM | POA: Diagnosis not present

## 2023-07-11 DIAGNOSIS — R030 Elevated blood-pressure reading, without diagnosis of hypertension: Secondary | ICD-10-CM | POA: Diagnosis not present

## 2023-07-11 DIAGNOSIS — Z6828 Body mass index (BMI) 28.0-28.9, adult: Secondary | ICD-10-CM | POA: Diagnosis not present

## 2023-08-25 DIAGNOSIS — H2513 Age-related nuclear cataract, bilateral: Secondary | ICD-10-CM | POA: Diagnosis not present

## 2023-08-25 DIAGNOSIS — H25013 Cortical age-related cataract, bilateral: Secondary | ICD-10-CM | POA: Diagnosis not present

## 2023-08-25 DIAGNOSIS — H2512 Age-related nuclear cataract, left eye: Secondary | ICD-10-CM | POA: Diagnosis not present

## 2023-08-25 DIAGNOSIS — H25043 Posterior subcapsular polar age-related cataract, bilateral: Secondary | ICD-10-CM | POA: Diagnosis not present

## 2023-08-25 DIAGNOSIS — H18413 Arcus senilis, bilateral: Secondary | ICD-10-CM | POA: Diagnosis not present

## 2023-09-14 DIAGNOSIS — H2512 Age-related nuclear cataract, left eye: Secondary | ICD-10-CM | POA: Diagnosis not present

## 2023-09-15 DIAGNOSIS — H2511 Age-related nuclear cataract, right eye: Secondary | ICD-10-CM | POA: Diagnosis not present

## 2023-09-28 DIAGNOSIS — H2511 Age-related nuclear cataract, right eye: Secondary | ICD-10-CM | POA: Diagnosis not present

## 2023-10-01 ENCOUNTER — Encounter: Payer: Self-pay | Admitting: Dermatology

## 2023-10-01 ENCOUNTER — Ambulatory Visit: Payer: No Typology Code available for payment source | Admitting: Dermatology

## 2023-10-01 DIAGNOSIS — W908XXA Exposure to other nonionizing radiation, initial encounter: Secondary | ICD-10-CM | POA: Diagnosis not present

## 2023-10-01 DIAGNOSIS — L570 Actinic keratosis: Secondary | ICD-10-CM

## 2023-10-01 DIAGNOSIS — D1801 Hemangioma of skin and subcutaneous tissue: Secondary | ICD-10-CM

## 2023-10-01 DIAGNOSIS — L814 Other melanin hyperpigmentation: Secondary | ICD-10-CM

## 2023-10-01 DIAGNOSIS — Z79899 Other long term (current) drug therapy: Secondary | ICD-10-CM

## 2023-10-01 DIAGNOSIS — L821 Other seborrheic keratosis: Secondary | ICD-10-CM | POA: Diagnosis not present

## 2023-10-01 DIAGNOSIS — I781 Nevus, non-neoplastic: Secondary | ICD-10-CM

## 2023-10-01 DIAGNOSIS — L578 Other skin changes due to chronic exposure to nonionizing radiation: Secondary | ICD-10-CM | POA: Diagnosis not present

## 2023-10-01 DIAGNOSIS — D692 Other nonthrombocytopenic purpura: Secondary | ICD-10-CM | POA: Diagnosis not present

## 2023-10-01 DIAGNOSIS — I8393 Asymptomatic varicose veins of bilateral lower extremities: Secondary | ICD-10-CM | POA: Diagnosis not present

## 2023-10-01 DIAGNOSIS — Z5111 Encounter for antineoplastic chemotherapy: Secondary | ICD-10-CM

## 2023-10-01 DIAGNOSIS — L738 Other specified follicular disorders: Secondary | ICD-10-CM

## 2023-10-01 DIAGNOSIS — D229 Melanocytic nevi, unspecified: Secondary | ICD-10-CM

## 2023-10-01 DIAGNOSIS — L820 Inflamed seborrheic keratosis: Secondary | ICD-10-CM | POA: Diagnosis not present

## 2023-10-01 DIAGNOSIS — Z7189 Other specified counseling: Secondary | ICD-10-CM

## 2023-10-01 DIAGNOSIS — Z1283 Encounter for screening for malignant neoplasm of skin: Secondary | ICD-10-CM

## 2023-10-01 DIAGNOSIS — L719 Rosacea, unspecified: Secondary | ICD-10-CM

## 2023-10-01 NOTE — Progress Notes (Signed)
 Follow-Up Visit   Subjective  Shelly Wood is a 68 y.o. female who presents for the following: Skin Cancer Screening and Full Body Skin Exam Spot at right forearm  Hx of rosacea, hx of isks, hx of aks,   The patient presents for Total-Body Skin Exam (TBSE) for skin cancer screening and mole check. The patient has spots, moles and lesions to be evaluated, some may be new or changing and the patient may have concern these could be cancer.  The following portions of the chart were reviewed this encounter and updated as appropriate: medications, allergies, medical history  Review of Systems:  No other skin or systemic complaints except as noted in HPI or Assessment and Plan.  Objective  Well appearing patient in no apparent distress; mood and affect are within normal limits.  A full examination was performed including scalp, head, eyes, ears, nose, lips, neck, chest, axillae, abdomen, back, buttocks, bilateral upper extremities, bilateral lower extremities, hands, feet, fingers, toes, fingernails, and toenails. All findings within normal limits unless otherwise noted below.   Relevant physical exam findings are noted in the Assessment and Plan.  right forearm x 1, left popliteal x 1 Erythematous stuck-on, waxy papule or plaque right lateral nasal medial bridge x 1  See photo - photo taken after LN2 tx, so appears edematous Mild erythema with mild texture change. May be minimal residual AK after previous LN2 and 5FU treatment.  If persistent in future may consider biopsy.  See photo - photo taken after LN2 tx, so appears edematous   Assessment & Plan   SKIN CANCER SCREENING PERFORMED TODAY.   LENTIGINES, SEBORRHEIC KERATOSES, HEMANGIOMAS - Benign normal skin lesions - Benign-appearing - Call for any changes  MELANOCYTIC NEVI - Tan-brown and/or pink-flesh-colored symmetric macules and papules - Benign appearing on exam today - Observation - Call clinic for new or changing  moles - Recommend daily use of broad spectrum spf 30+ sunscreen to sun-exposed areas.   Purpura - Chronic; persistent and recurrent.  Treatable, but not curable. - Violaceous macules and patches - Benign - Related to trauma, age, sun damage and/or use of blood thinners, chronic use of topical and/or oral steroids - Observe - Can use OTC arnica containing moisturizer such as Dermend Bruise Formula if desired - Call for worsening or other concerns  Sebaceous Hyperplasia of left cheek - Small yellow papules with a central dell - Benign-appearing - Observe. Call for changes.    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 OBSERVE  No recommended treatment  Varicose Veins/Spider Veins - Dilated blue, purple or red veins at the lower extremities - Reassured - Smaller vessels can be treated by sclerotherapy (a procedure to inject a medicine into the veins to make them disappear) if desired, but the treatment is not covered by insurance. Larger vessels may be covered if symptomatic and we would refer to vascular surgeon if treatment desired.   ROSACEA mild with telangectasia  Exam Mid face erythema with telangiectasias  Chronic and persistent condition with duration or expected duration over one year. Condition is symptomatic / bothersome to patient. Not to goal. Rosacea is a chronic progressive skin condition usually affecting the face of adults, causing redness and/or acne bumps. It is treatable but not curable. It sometimes affects the eyes (ocular rosacea) as well. It may  respond to topical and/or systemic medication and can flare with stress, sun exposure, alcohol, exercise, topical steroids (including hydrocortisone/cortisone 10) and some foods.  Daily application of broad spectrum spf 30+ sunscreen to face is  recommended to reduce flares.   Patient denies grittiness of the eyes   Treatment Plan Counseling for BBL / IPL / Laser and Coordination of Care Discussed the treatment option of Broad Band Light (BBL) /Intense Pulsed Light (IPL)/ Laser for skin discoloration, including brown spots and redness.  Typically we recommend at least 1-3 treatment sessions about 5-8 weeks apart for best results.  Cannot have tanned skin when BBL performed, and regular use of sunscreen/photoprotection is advised after the procedure to help maintain results. The patient's condition may also require "maintenance treatments" in the future.  The fee for BBL / laser treatments is $350 per treatment session for the whole face.  A fee can be quoted for other parts of the body.  Insurance typically does not pay for BBL/laser treatments and therefore the fee is an out-of-pocket cost. Recommend prophylactic valtrex treatment. Once scheduled for procedure, will send Rx in prior to patient's appointment.    INFLAMED SEBORRHEIC KERATOSIS right forearm x 1, left popliteal x 1 Symptomatic, irritating, patient would like treated.  Patient advised to call if spot is not resolved in 2 months  Destruction of lesion - right forearm x 1, left popliteal x 1 Complexity: simple   Destruction method: cryotherapy   Informed consent: discussed and consent obtained   Timeout:  patient name, date of birth, surgical site, and procedure verified Lesion destroyed using liquid nitrogen: Yes   Region frozen until ice ball extended beyond lesion: Yes   Outcome: patient tolerated procedure well with no complications   Post-procedure details: wound care instructions given   ACTINIC KERATOSIS right lateral nasal medial bridge x 1  See photo - photo taken after LN2 tx, so appears edematous Mild erythema with mild texture change. May be minimal residual AK after previous LN2 and 5FU treatment.  If persistent in future may consider biopsy.  Restart on  June 1  - Start 5-fluorouracil /calcipotriene cream twice a day for 5 days to affected areas including right lateral nasal medial bridge . Prescription sent to [facility]. Patient advised they will receive an email to purchase the medication online and have it sent to their home. Patient provided with handout reviewing treatment course and side effects and advised to call or message us  on MyChart with any concerns.  Reviewed course of treatment and expected reaction.  Patient advised to expect inflammation and crusting and advised that erosions are possible.  Patient advised to be diligent with sun protection during and after treatment. Counseled to keep medication out of reach of children and pets.   ACTINIC DAMAGE WITH PRECANCEROUS ACTINIC KERATOSES Counseling for Topical Chemotherapy Management: Patient exhibits: - Severe, confluent actinic changes with pre-cancerous actinic keratoses that is secondary to cumulative UV radiation exposure over time - Condition that is severe; chronic, not at goal. - diffuse scaly erythematous macules and papules with underlying dyspigmentation - Discussed Prescription "Field Treatment" topical Chemotherapy for Severe, Chronic Confluent Actinic Changes with Pre-Cancerous Actinic Keratoses Field treatment involves treatment of an entire area of skin that has confluent Actinic Changes (Sun/ Ultraviolet light damage) and PreCancerous Actinic Keratoses by method of PhotoDynamic Therapy (PDT) and/or prescription Topical Chemotherapy agents such as 5-fluorouracil , 5-fluorouracil /calcipotriene, and/or imiquimod.  The purpose is to decrease the number of clinically evident and subclinical PreCancerous lesions  to prevent progression to development of skin cancer by chemically destroying early precancer changes that may or may not be visible.  It has been shown to reduce the risk of developing skin cancer in the treated area. As a result of treatment,  redness, scaling, crusting, and open sores may occur during treatment course. One or more than one of these methods may be used and may have to be used several times to control, suppress and eliminate the PreCancerous changes. Discussed treatment course, expected reaction, and possible side effects. - Recommend daily broad spectrum sunscreen SPF 30+ to sun-exposed areas, reapply every 2 hours as needed.  - Staying in the shade or wearing long sleeves, sun glasses (UVA+UVB protection) and wide brim hats (4-inch brim around the entire circumference of the hat) are also recommended. - Call for new or changing lesions.  Actinic keratoses are precancerous spots that appear secondary to cumulative UV radiation exposure/sun exposure over time. They are chronic with expected duration over 1 year. A portion of actinic keratoses will progress to squamous cell carcinoma of the skin. It is not possible to reliably predict which spots will progress to skin cancer and so treatment is recommended to prevent development of skin cancer.  Recommend daily broad spectrum sunscreen SPF 30+ to sun-exposed areas, reapply every 2 hours as needed.  Recommend staying in the shade or wearing long sleeves, sun glasses (UVA+UVB protection) and wide brim hats (4-inch brim around the entire circumference of the hat). Call for new or changing lesions. Destruction of lesion - right lateral nasal medial bridge x 1  See photo - photo taken after LN2 tx, so appears edematous Complexity: simple   Destruction method: cryotherapy   Informed consent: discussed and consent obtained   Timeout:  patient name, date of birth, surgical site, and procedure verified Lesion destroyed using liquid nitrogen: Yes   Region frozen until ice ball extended beyond lesion: Yes   Outcome: patient tolerated procedure well with no complications   Post-procedure details: wound care instructions given   ACTINIC SKIN DAMAGE   LENTIGO   MELANOCYTIC NEVUS,  UNSPECIFIED LOCATION   SKIN CANCER SCREENING   CHEMOTHERAPY MANAGEMENT, ENCOUNTER FOR   COUNSELING AND COORDINATION OF CARE   MEDICATION MANAGEMENT   PURPURA (HCC)   TELANGIECTASIA   ROSACEA   Return for october ak follow up, 1 year tbse .  IRandee Busing, CMA, am acting as scribe for Celine Collard, MD.   Documentation: I have reviewed the above documentation for accuracy and completeness, and I agree with the above.  Celine Collard, MD

## 2023-10-01 NOTE — Patient Instructions (Signed)
 Start Cream June 1 - Start 5-fluorouracil /calcipotriene cream twice a day for 5 days to affected areas including right nasal bridge .  Reviewed course of treatment and expected reaction.  Patient advised to expect inflammation and crusting and advised that erosions are possible.  Patient advised to be diligent with sun protection during and after treatment. Counseled to keep medication out of reach of children and pets.   5-Fluorouracil /Calcipotriene Patient Education   Actinic keratoses are the dry, red scaly spots on the skin caused by sun damage. A portion of these spots can turn into skin cancer with time, and treating them can help prevent development of skin cancer.   Treatment of these spots requires removal of the defective skin cells. There are various ways to remove actinic keratoses, including freezing with liquid nitrogen, treatment with creams, or treatment with a blue light procedure in the office.   5-fluorouracil  cream is a topical cream used to treat actinic keratoses. It works by interfering with the growth of abnormal fast-growing skin cells, such as actinic keratoses. These cells peel off and are replaced by healthy ones.   5-fluorouracil /calcipotriene is a combination of the 5-fluorouracil  cream with a vitamin D analog cream called calcipotriene. The calcipotriene alone does not treat actinic keratoses. However, when it is combined with 5-fluorouracil , it helps the 5-fluorouracil  treat the actinic keratoses much faster so that the same results can be achieved with a much shorter treatment time.  INSTRUCTIONS FOR 5-FLUOROURACIL /CALCIPOTRIENE CREAM:   5-fluorouracil /calcipotriene cream typically only needs to be used for 4-7 days. A thin layer should be applied twice a day to the treatment areas recommended by your physician.   If your physician prescribed you separate tubes of 5-fluourouracil and calcipotriene, apply a thin layer of 5-fluorouracil  followed by a thin layer of  calcipotriene.   Avoid contact with your eyes, nostrils, and mouth. Do not use 5-fluorouracil /calcipotriene cream on infected or open wounds.   You will develop redness, irritation and some crusting at areas where you have pre-cancer damage/actinic keratoses. IF YOU DEVELOP PAIN, BLEEDING, OR SIGNIFICANT CRUSTING, STOP THE TREATMENT EARLY - you have already gotten a good response and the actinic keratoses should clear up well.  Wash your hands after applying 5-fluorouracil  5% cream on your skin.   A moisturizer or sunscreen with a minimum SPF 30 should be applied each morning.   Once you have finished the treatment, you can apply a thin layer of Vaseline twice a day to irritated areas to soothe and calm the areas more quickly. If you experience significant discomfort, contact your physician.  For some patients it is necessary to repeat the treatment for best results.  SIDE EFFECTS: When using 5-fluorouracil /calcipotriene cream, you may have mild irritation, such as redness, dryness, swelling, or a mild burning sensation. This usually resolves within 2 weeks. The more actinic keratoses you have, the more redness and inflammation you can expect during treatment. Eye irritation has been reported rarely. If this occurs, please let us  know.  If you have any trouble using this cream, please call the office. If you have any other questions about this information, please do not hesitate to ask me before you leave the office.   Actinic keratoses are precancerous spots that appear secondary to cumulative UV radiation exposure/sun exposure over time. They are chronic with expected duration over 1 year. A portion of actinic keratoses will progress to squamous cell carcinoma of the skin. It is not possible to reliably predict which spots will progress to skin  cancer and so treatment is recommended to prevent development of skin cancer.  Recommend daily broad spectrum sunscreen SPF 30+ to sun-exposed areas,  reapply every 2 hours as needed.  Recommend staying in the shade or wearing long sleeves, sun glasses (UVA+UVB protection) and wide brim hats (4-inch brim around the entire circumference of the hat). Call for new or changing lesions.   Cryotherapy Aftercare  Wash gently with soap and water everyday.   Apply Vaseline and Band-Aid daily until healed.   Seborrheic Keratosis  What causes seborrheic keratoses? Seborrheic keratoses are harmless, common skin growths that first appear during adult life.  As time goes by, more growths appear.  Some people may develop a large number of them.  Seborrheic keratoses appear on both covered and uncovered body parts.  They are not caused by sunlight.  The tendency to develop seborrheic keratoses can be inherited.  They vary in color from skin-colored to gray, brown, or even black.  They can be either smooth or have a rough, warty surface.   Seborrheic keratoses are superficial and look as if they were stuck on the skin.  Under the microscope this type of keratosis looks like layers upon layers of skin.  That is why at times the top layer may seem to fall off, but the rest of the growth remains and re-grows.    Treatment Seborrheic keratoses do not need to be treated, but can easily be removed in the office.  Seborrheic keratoses often cause symptoms when they rub on clothing or jewelry.  Lesions can be in the way of shaving.  If they become inflamed, they can cause itching, soreness, or burning.  Removal of a seborrheic keratosis can be accomplished by freezing, burning, or surgery. If any spot bleeds, scabs, or grows rapidly, please return to have it checked, as these can be an indication of a skin cancer.  Melanoma ABCDEs  Melanoma is the most dangerous type of skin cancer, and is the leading cause of death from skin disease.  You are more likely to develop melanoma if you: Have light-colored skin, light-colored eyes, or red or blond hair Spend a lot of  time in the sun Tan regularly, either outdoors or in a tanning bed Have had blistering sunburns, especially during childhood Have a close family member who has had a melanoma Have atypical moles or large birthmarks  Early detection of melanoma is key since treatment is typically straightforward and cure rates are extremely high if we catch it early.   The first sign of melanoma is often a change in a mole or a new dark spot.  The ABCDE system is a way of remembering the signs of melanoma.  A for asymmetry:  The two halves do not match. B for border:  The edges of the growth are irregular. C for color:  A mixture of colors are present instead of an even brown color. D for diameter:  Melanomas are usually (but not always) greater than 6mm - the size of a pencil eraser. E for evolution:  The spot keeps changing in size, shape, and color.  Please check your skin once per month between visits. You can use a small mirror in front and a large mirror behind you to keep an eye on the back side or your body.   If you see any new or changing lesions before your next follow-up, please call to schedule a visit.  Please continue daily skin protection including broad spectrum sunscreen SPF 30+  to sun-exposed areas, reapplying every 2 hours as needed when you're outdoors.   Staying in the shade or wearing long sleeves, sun glasses (UVA+UVB protection) and wide brim hats (4-inch brim around the entire circumference of the hat) are also recommended for sun protection.    Due to recent changes in healthcare laws, you may see results of your pathology and/or laboratory studies on MyChart before the doctors have had a chance to review them. We understand that in some cases there may be results that are confusing or concerning to you. Please understand that not all results are received at the same time and often the doctors may need to interpret multiple results in order to provide you with the best plan of care  or course of treatment. Therefore, we ask that you please give us  2 business days to thoroughly review all your results before contacting the office for clarification. Should we see a critical lab result, you will be contacted sooner.   If You Need Anything After Your Visit  If you have any questions or concerns for your doctor, please call our main line at [phone] and press option 4 to reach your doctor's medical assistant. If no one answers, please leave a voicemail as directed and we will return your call as soon as possible. Messages left after 4 pm will be answered the following business day.   You may also send us  a message via MyChart. We typically respond to MyChart messages within 1-2 business days.  For prescription refills, please ask your pharmacy to contact our office. Our fax number is [phone].  If you have an urgent issue when the clinic is closed that cannot wait until the next business day, you can page your doctor at the number below.    Please note that while we do our best to be available for urgent issues outside of office hours, we are not available 24/7.   If you have an urgent issue and are unable to reach us , you may choose to seek medical care at your doctor's office, retail clinic, urgent care center, or emergency room.  If you have a medical emergency, please immediately call 911 or go to the emergency department.  Pager Numbers  - Dr. Bary Likes: [phone]  - Dr. Annette Barters: [phone]  - Dr. Felipe Horton: [phone]   In the event of inclement weather, please call our main line at [phone] for an update on the status of any delays or closures.  Dermatology Medication Tips: Please keep the boxes that topical medications come in in order to help keep track of the instructions about where and how to use these. Pharmacies typically print the medication instructions only on the boxes and not directly on the medication tubes.   If your medication is  too expensive, please contact our office at [phone] option 4 or send us  a message through MyChart.   We are unable to tell what your co-pay for medications will be in advance as this is different depending on your insurance coverage. However, we may be able to find a substitute medication at lower cost or fill out paperwork to get insurance to cover a needed medication.   If a prior authorization is required to get your medication covered by your insurance company, please allow us  1-2 business days to complete this process.  Drug prices often vary depending on where the prescription is filled and some pharmacies may offer cheaper prices.  The website www.goodrx.com contains coupons for medications through different pharmacies.  The prices here do not account for what the cost may be with help from insurance (it may be cheaper with your insurance), but the website can give you the price if you did not use any insurance.  - You can print the associated coupon and take it with your prescription to the pharmacy.  - You may also stop by our office during regular business hours and pick up a GoodRx coupon card.  - If you need your prescription sent electronically to a different pharmacy, notify our office through [facility] or by phone at [phone] option 4.     Si Usted Necesita Algo Despus de Su Visita  Tambin puede enviarnos un mensaje a travs de Clinical cytogeneticist. Por lo general respondemos a los mensajes de MyChart en el transcurso de 1 a 2 das hbiles.  Para renovar recetas, por favor pida a su farmacia que se ponga en contacto con nuestra oficina. Franz Jacks de fax es [place] [phone].  Si tiene un asunto urgente cuando la clnica est cerrada y que no puede esperar hasta el siguiente da hbil, puede llamar/localizar a su doctor(a) al nmero que aparece a continuacin.   Por favor, tenga en cuenta que aunque hacemos todo lo posible para estar disponibles para asuntos urgentes  fuera del horario de [place], no estamos disponibles las 24 horas del da, los 7 [address]  [address] de la [place].   Si tiene un problema urgente y no puede comunicarse con nosotros, puede optar por buscar atencin mdica  en el consultorio de su doctor(a), en una clnica privada, en un centro de atencin urgente o en una sala de emergencias.  Si tiene Engineer, drilling, por favor llame inmediatamente al 911 o vaya a la sala de emergencias.  Nmeros de bper  - Dr. Bary Likes: [phone]  - Dra. Annette Barters: [phone]  - Dr. Felipe Horton: [phone]   En caso de inclemencias del tiempo, por favor llame a Lajuan Pila principal al [phone] para una actualizacin sobre el [place] de cualquier retraso o cierre.  Consejos para la medicacin en dermatologa: Por favor, guarde las cajas en las que vienen los medicamentos de uso tpico para ayudarle a seguir las instrucciones sobre dnde y cmo usarlos. Las farmacias generalmente imprimen las instrucciones del medicamento slo en las cajas y no directamente en los tubos del [place].   Si su medicamento es muy caro, por favor, pngase en contacto con Bettyjane Brunet llamando al [phone] y presione la opcin 4 o envenos un mensaje a travs de Clinical cytogeneticist.   No podemos decirle cul ser su copago por los medicamentos por adelantado ya que esto es diferente dependiendo de la cobertura de su seguro. Sin embargo, es posible que podamos encontrar un medicamento sustituto a Audiological scientist un formulario para que el seguro cubra el medicamento que se considera necesario.   Si se requiere una autorizacin previa para que su compaa de seguros [place] su medicamento, por favor permtanos de 1 a 2 das hbiles para completar este proceso.  Los precios de los medicamentos varan con frecuencia dependiendo del Environmental consultant de dnde se surte la receta y alguna farmacias pueden ofrecer precios ms baratos.  El sitio web www.goodrx.com tiene cupones para medicamentos de  Health and safety inspector. Los precios aqu no tienen en cuenta lo que podra costar con la ayuda del seguro (puede ser ms barato con su seguro), pero el sitio web puede darle el precio si no utiliz Tourist information centre manager.  - Puede imprimir el cupn correspondiente y llevarlo con su  receta a la farmacia.  - Tambin puede pasar por nuestra oficina durante el horario de atencin regular y Education officer, museum una tarjeta de cupones de GoodRx.  - Si necesita que su receta se enve electrnicamente a una farmacia diferente, informe a nuestra oficina a travs de MyChart de [place] o por telfono llamando al [phone] y presione la opcin 4.

## 2023-10-14 ENCOUNTER — Other Ambulatory Visit (HOSPITAL_COMMUNITY): Payer: Self-pay | Admitting: Internal Medicine

## 2023-10-14 DIAGNOSIS — M549 Dorsalgia, unspecified: Secondary | ICD-10-CM | POA: Diagnosis not present

## 2023-10-14 DIAGNOSIS — M47814 Spondylosis without myelopathy or radiculopathy, thoracic region: Secondary | ICD-10-CM | POA: Diagnosis not present

## 2023-10-14 DIAGNOSIS — Z0001 Encounter for general adult medical examination with abnormal findings: Secondary | ICD-10-CM | POA: Diagnosis not present

## 2023-10-14 DIAGNOSIS — M5414 Radiculopathy, thoracic region: Secondary | ICD-10-CM | POA: Diagnosis not present

## 2023-10-14 DIAGNOSIS — M5114 Intervertebral disc disorders with radiculopathy, thoracic region: Secondary | ICD-10-CM | POA: Diagnosis not present

## 2023-10-14 DIAGNOSIS — E663 Overweight: Secondary | ICD-10-CM | POA: Diagnosis not present

## 2023-10-14 DIAGNOSIS — E559 Vitamin D deficiency, unspecified: Secondary | ICD-10-CM | POA: Diagnosis not present

## 2023-10-14 DIAGNOSIS — I10 Essential (primary) hypertension: Secondary | ICD-10-CM | POA: Diagnosis not present

## 2023-10-14 DIAGNOSIS — Z6829 Body mass index (BMI) 29.0-29.9, adult: Secondary | ICD-10-CM | POA: Diagnosis not present

## 2023-10-14 DIAGNOSIS — R5383 Other fatigue: Secondary | ICD-10-CM | POA: Diagnosis not present

## 2023-10-14 DIAGNOSIS — M1991 Primary osteoarthritis, unspecified site: Secondary | ICD-10-CM | POA: Diagnosis not present

## 2023-10-15 ENCOUNTER — Ambulatory Visit (HOSPITAL_COMMUNITY)
Admission: RE | Admit: 2023-10-15 | Discharge: 2023-10-15 | Disposition: A | Discharge status: Home / Self Care | Source: Ambulatory Visit | Attending: Internal Medicine | Admitting: Internal Medicine

## 2023-10-15 DIAGNOSIS — M549 Dorsalgia, unspecified: Secondary | ICD-10-CM | POA: Diagnosis not present

## 2023-10-15 DIAGNOSIS — M47814 Spondylosis without myelopathy or radiculopathy, thoracic region: Secondary | ICD-10-CM | POA: Diagnosis not present

## 2023-10-15 DIAGNOSIS — M419 Scoliosis, unspecified: Secondary | ICD-10-CM | POA: Diagnosis not present

## 2023-10-19 DIAGNOSIS — E2749 Other adrenocortical insufficiency: Secondary | ICD-10-CM | POA: Diagnosis not present

## 2023-10-27 DIAGNOSIS — Z1231 Encounter for screening mammogram for malignant neoplasm of breast: Secondary | ICD-10-CM | POA: Diagnosis not present

## 2023-10-27 DIAGNOSIS — B029 Zoster without complications: Secondary | ICD-10-CM | POA: Diagnosis not present

## 2023-10-27 DIAGNOSIS — Z124 Encounter for screening for malignant neoplasm of cervix: Secondary | ICD-10-CM | POA: Diagnosis not present

## 2023-10-27 DIAGNOSIS — L232 Allergic contact dermatitis due to cosmetics: Secondary | ICD-10-CM | POA: Diagnosis not present

## 2023-10-27 DIAGNOSIS — Z6828 Body mass index (BMI) 28.0-28.9, adult: Secondary | ICD-10-CM | POA: Diagnosis not present

## 2023-11-07 DIAGNOSIS — M1991 Primary osteoarthritis, unspecified site: Secondary | ICD-10-CM | POA: Diagnosis not present

## 2023-11-07 DIAGNOSIS — I700 Atherosclerosis of aorta: Secondary | ICD-10-CM | POA: Diagnosis not present

## 2023-11-07 DIAGNOSIS — E039 Hypothyroidism, unspecified: Secondary | ICD-10-CM | POA: Diagnosis not present

## 2023-11-07 DIAGNOSIS — E2749 Other adrenocortical insufficiency: Secondary | ICD-10-CM | POA: Diagnosis not present

## 2023-11-07 DIAGNOSIS — I10 Essential (primary) hypertension: Secondary | ICD-10-CM | POA: Diagnosis not present

## 2023-11-24 DIAGNOSIS — R7989 Other specified abnormal findings of blood chemistry: Secondary | ICD-10-CM | POA: Diagnosis not present

## 2023-12-12 ENCOUNTER — Emergency Department (HOSPITAL_COMMUNITY)
Admission: EM | Admit: 2023-12-12 | Discharge: 2023-12-12 | Disposition: A | Discharge status: Home / Self Care | Attending: Emergency Medicine | Admitting: Emergency Medicine

## 2023-12-12 ENCOUNTER — Other Ambulatory Visit: Payer: Self-pay

## 2023-12-12 ENCOUNTER — Emergency Department (HOSPITAL_COMMUNITY)

## 2023-12-12 ENCOUNTER — Encounter (HOSPITAL_COMMUNITY): Payer: Self-pay

## 2023-12-12 DIAGNOSIS — T7840XA Allergy, unspecified, initial encounter: Secondary | ICD-10-CM | POA: Diagnosis not present

## 2023-12-12 DIAGNOSIS — R0602 Shortness of breath: Secondary | ICD-10-CM | POA: Diagnosis not present

## 2023-12-12 DIAGNOSIS — R0600 Dyspnea, unspecified: Secondary | ICD-10-CM | POA: Diagnosis not present

## 2023-12-12 DIAGNOSIS — R059 Cough, unspecified: Secondary | ICD-10-CM | POA: Diagnosis not present

## 2023-12-12 LAB — COMPREHENSIVE METABOLIC PANEL WITH GFR
ALT: 21 U/L (ref 0–44)
AST: 28 U/L (ref 15–41)
Albumin: 3.3 g/dL — ABNORMAL LOW (ref 3.5–5.0)
Alkaline Phosphatase: 71 U/L (ref 38–126)
Anion gap: 11 (ref 5–15)
BUN: 17 mg/dL (ref 8–23)
CO2: 25 mmol/L (ref 22–32)
Calcium: 8.6 mg/dL — ABNORMAL LOW (ref 8.9–10.3)
Chloride: 105 mmol/L (ref 98–111)
Creatinine, Ser: 1.01 mg/dL — ABNORMAL HIGH (ref 0.44–1.00)
GFR, Estimated: >60 mL/min (ref >60)
Glucose, Bld: 185 mg/dL — ABNORMAL HIGH (ref 70–99)
Potassium: 3.7 mmol/L (ref 3.5–5.1)
Sodium: 141 mmol/L (ref 135–145)
Total Bilirubin: 0.9 mg/dL (ref 0.0–1.2)
Total Protein: 6.3 g/dL — ABNORMAL LOW (ref 6.5–8.1)

## 2023-12-12 LAB — CBC WITH DIFFERENTIAL/PLATELET
Abs Immature Granulocytes: 0.11 K/uL — ABNORMAL HIGH (ref 0.00–0.07)
Basophils Absolute: 0.1 K/uL (ref 0.0–0.1)
Basophils Relative: 0 %
Eosinophils Absolute: 0.1 K/uL (ref 0.0–0.5)
Eosinophils Relative: 0 %
HCT: 44.7 % (ref 36.0–46.0)
Hemoglobin: 14.5 g/dL (ref 12.0–15.0)
Immature Granulocytes: 1 %
Lymphocytes Relative: 19 %
Lymphs Abs: 4.3 K/uL — ABNORMAL HIGH (ref 0.7–4.0)
MCH: 30 pg (ref 26.0–34.0)
MCHC: 32.4 g/dL (ref 30.0–36.0)
MCV: 92.5 fL (ref 80.0–100.0)
Monocytes Absolute: 0.6 K/uL (ref 0.1–1.0)
Monocytes Relative: 3 %
Neutro Abs: 17 K/uL — ABNORMAL HIGH (ref 1.7–7.7)
Neutrophils Relative %: 77 %
Platelets: 370 K/uL (ref 150–400)
RBC: 4.83 MIL/uL (ref 3.87–5.11)
RDW: 13.7 % (ref 11.5–15.5)
WBC: 22.1 K/uL — ABNORMAL HIGH (ref 4.0–10.5)
nRBC: 0 % (ref 0.0–0.2)

## 2023-12-12 LAB — TROPONIN I (HIGH SENSITIVITY)
Troponin I (High Sensitivity): 5 ng/L (ref <18)
Troponin I (High Sensitivity): 5 ng/L (ref <18)

## 2023-12-12 MED ORDER — FAMOTIDINE IN NACL 20-0.9 MG/50ML-% IV SOLN
20.0000 mg | Freq: Once | INTRAVENOUS | Status: AC
  Administered 2023-12-12: 20 mg via INTRAVENOUS
  Filled 2023-12-12: qty 50

## 2023-12-12 MED ORDER — SODIUM CHLORIDE 0.9 % IV BOLUS
1000.0000 mL | Freq: Once | INTRAVENOUS | Status: AC
  Administered 2023-12-12: 1000 mL via INTRAVENOUS

## 2023-12-12 MED ORDER — EPINEPHRINE 0.3 MG/0.3ML IJ SOAJ
0.3000 mg | Freq: Once | INTRAMUSCULAR | Status: AC
  Administered 2023-12-12: 0.3 mg via INTRAMUSCULAR

## 2023-12-12 MED ORDER — EPINEPHRINE 0.3 MG/0.3ML IJ SOAJ
INTRAMUSCULAR | Status: AC
  Filled 2023-12-12: qty 0.3

## 2023-12-12 MED ORDER — DEXAMETHASONE SODIUM PHOSPHATE 10 MG/ML IJ SOLN
10.0000 mg | Freq: Once | INTRAMUSCULAR | Status: AC
  Administered 2023-12-12: 10 mg via INTRAVENOUS
  Filled 2023-12-12: qty 1

## 2023-12-12 MED ORDER — EPINEPHRINE 0.3 MG/0.3ML IJ SOAJ: 0.3000 mg | INTRAMUSCULAR | 0 refills | Status: AC | PRN

## 2023-12-12 MED ORDER — DIPHENHYDRAMINE HCL 25 MG PO TABS: 25.0000 mg | ORAL_TABLET | Freq: Four times a day (QID) | ORAL | 0 refills | Status: AC | PRN

## 2023-12-12 MED ORDER — FAMOTIDINE 20 MG PO TABS
20.0000 mg | ORAL_TABLET | Freq: Two times a day (BID) | ORAL | 0 refills | Status: AC
Start: 2023-12-12 — End: 2023-12-17

## 2023-12-12 NOTE — ED Notes (Signed)
 ED Provider at bedside.

## 2023-12-12 NOTE — ED Notes (Signed)
 Pt ambulated independently to bathroom.

## 2023-12-12 NOTE — ED Triage Notes (Signed)
 Pt arrives from home with allergic reaction after taking methylprednisolone . Pt states she took this because she was having a cough and a sore throat. After taking pt had episode of vomiting EMS reports BP 88/50 initially with room O2 of 80%. Placed on 3L [place] 18g R AC placed and pt had Epi, 4 mg zofran, and 50 mg Benadryl  PTA. Pt A&O x4. Pt states that she has been having episodes where she becomes red and itchy at home.

## 2023-12-12 NOTE — ED Notes (Signed)
 Pt sitting up in bed reports feeling better at this time. Pt skin has returned to baseline and she is free from complaints

## 2023-12-12 NOTE — ED Notes (Signed)
 Pt/family received d/c paperwork at this time. After going over the paperwork any questions, comments, or concerns were answered to the best of this nurse's knowledge. The pt/family verbally acknowledged the teachings/instructions.

## 2023-12-12 NOTE — ED Provider Notes (Signed)
 [place] EMERGENCY DEPARTMENT AT [facility] Provider Note   CSN: [id number] Arrival date & time: 12/12/23  1119     Patient presents with: Allergic Reaction   Shelly Wood is a 68 y.o. female.   Patient is a 68 year old female who presents emergency department the chief complaint of rash, shortness of breath and difficulty swallowing.  She notes that she did start taking a prescription for Medrol  today and did take a dose of Coricidin.  She denies any history of any difficulty taking his medications in the past.  She denies any other new soaps, lotions, deodorants, detergents or medications.  She has not recently been on antibiotics.  She denies any history of anaphylaxis.  She does note that over the past year she has been having bouts of pruritus and intermittent rashes.   Allergic Reaction Presenting symptoms: rash and wheezing        Prior to Admission medications   Medication Sig Start Date End Date Taking? Authorizing Provider  diphenhydrAMINE  (BENADRYL ) 25 MG tablet Take 1 tablet (25 mg total) by mouth every 6 (six) hours as needed for up to 5 days. 12/12/23 12/17/23 Yes Daralene Lonni BIRCH, PA-C  EPINEPHrine  0.3 mg/0.3 mL IJ SOAJ injection Inject 0.3 mg into the muscle as needed for anaphylaxis. 12/12/23  Yes Daralene Lonni D, PA-C  famotidine  (PEPCID ) 20 MG tablet Take 1 tablet (20 mg total) by mouth 2 (two) times daily for 5 days. 12/12/23 12/17/23 Yes Daralene Lonni D, PA-C  cholecalciferol (VITAMIN D) 1000 units tablet Take 1,000 Units by mouth daily.    [provider]  diazepam (VALIUM) 10 MG tablet Take 1 tablet by mouth at bedtime as needed for sleep. 10/30/16   [provider]  hydrochlorothiazide  (HYDRODIURIL ) 25 MG tablet Take 25 mg by mouth daily. 11/16/16   [provider]  Levothyroxine  Sodium 75 MCG CAPS Take 75-100 mcg by mouth daily. 02/19/10   [provider]  rosuvastatin (CRESTOR) 10 MG tablet Take 10 mg by  mouth daily. 12/31/20   [provider]    Allergies: Patient has no known allergies.    Review of Systems  Respiratory:  Positive for shortness of breath and wheezing.   Skin:  Positive for rash.    Updated Vital Signs BP (!) 120/59   Pulse 91   Temp (!) 96.6 F (35.9 C) (Axillary)   Resp 13   Ht 5' 7 (1.702 m)   Wt 77.1 kg   SpO2 96%   BMI 26.63 kg/m   Physical Exam Vitals and nursing note reviewed.  Constitutional:      Appearance: Normal appearance.  HENT:     Head: Normocephalic and atraumatic.     Nose: Nose normal.     Mouth/Throat:     Mouth: Mucous membranes are moist.  Eyes:     Extraocular Movements: Extraocular movements intact.     Conjunctiva/sclera: Conjunctivae normal.     Pupils: Pupils are equal, round, and reactive to light.  Cardiovascular:     Rate and Rhythm: Normal rate and regular rhythm.     Pulses: Normal pulses.     Heart sounds: Normal heart sounds. No murmur heard.    No gallop.  Pulmonary:     Effort: Pulmonary effort is normal. No respiratory distress.     Breath sounds: Normal breath sounds. No stridor. No wheezing, rhonchi or rales.  Abdominal:     General: Abdomen is flat. Bowel sounds are normal. There is no  distension.     Palpations: Abdomen is soft.     Tenderness: There is no abdominal tenderness. There is no guarding.  Musculoskeletal:        General: Normal range of motion.     Cervical back: Normal range of motion and neck supple.  Skin:    General: Skin is warm and dry.     Comments: Diffuse hives noted throughout, no petechiae, purpura, bullae  Neurological:     General: No focal deficit present.     Mental Status: She is alert and oriented to person, place, and time. Mental status is at baseline.  Psychiatric:        Mood and Affect: Mood normal.        Behavior: Behavior normal.        Thought Content: Thought content normal.        Judgment: Judgment normal.     (all labs ordered are listed, but  only abnormal results are displayed) Labs Reviewed  COMPREHENSIVE METABOLIC PANEL WITH GFR - Abnormal; Notable for the following components:      Result Value   Glucose, Bld 185 (*)    Creatinine, Ser 1.01 (*)    Calcium 8.6 (*)    Total Protein 6.3 (*)    Albumin 3.3 (*)    All other components within normal limits  CBC WITH DIFFERENTIAL/PLATELET - Abnormal; Notable for the following components:   WBC 22.1 (*)    Neutro Abs 17.0 (*)    Lymphs Abs 4.3 (*)    Abs Immature Granulocytes 0.11 (*)    All other components within normal limits  TROPONIN I (HIGH SENSITIVITY)  TROPONIN I (HIGH SENSITIVITY)    EKG: None  Radiology: [facility] Chest Port 1 View Result Date: 12/12/2023 CLINICAL DATA:  Cough.  Dyspnea. EXAM: PORTABLE CHEST 1 VIEW COMPARISON:  01/23/2022 FINDINGS: Heart size and mediastinal contours are normal. No pleural fluid, interstitial edema or airspace disease. Visualized osseous structures are unremarkable. IMPRESSION: No active disease. Electronically Signed   By: Waddell Calk M.D.   On: 12/12/2023 13:13     Procedures   Medications Ordered in the ED  famotidine  (PEPCID ) IVPB 20 mg premix (0 mg Intravenous Stopped 12/12/23 1302)  sodium chloride  0.9 % bolus 1,000 mL (0 mLs Intravenous Stopped 12/12/23 1302)  dexamethasone  (DECADRON ) injection 10 mg (10 mg Intravenous Given 12/12/23 1146)  EPINEPHrine  (EPI-PEN) injection 0.3 mg ( Intramuscular Canceled Entry 12/12/23 1157)                                    Medical Decision Making Patient is doing much better at this time and is stable for discharge home.  She has been monitored in the emergency department for approximately 4 hours.  She has had no recurrence of symptoms.  Will avoid any further steroid medications at this time given her exposure to the prednisolone and her reaction.  Will discharge patient on continue Benadryl , Pepcid  and provide an EpiPen .  Patient was educated on the use of the EpiPen  and her husband was  present for this.  I did provide information for follow-up with an allergist as well.  Blood work has been overall unremarkable in the emergency department and she has negative serial troponins.  Strict return precautions were discussed for any new or worsening symptoms.  Patient voiced understanding to the plan and had no additional questions.  Amount and/or Complexity of Data  Reviewed Labs: ordered. Radiology: ordered.  Risk OTC drugs. Prescription drug management.        Final diagnoses:  Allergic reaction, initial encounter    ED Discharge Orders          Ordered    EPINEPHrine  0.3 mg/0.3 mL IJ SOAJ injection  As needed        12/12/23 1542    diphenhydrAMINE  (BENADRYL ) 25 MG tablet  Every 6 hours PRN        12/12/23 1542    famotidine  (PEPCID ) 20 MG tablet  2 times daily        12/12/23 1542               Desirie Minteer D, PA-C 12/12/23 1545    Suzette Pac, MD 12/13/23 1707

## 2023-12-12 NOTE — Discharge Instructions (Signed)
 Please follow-up closely with your primary care doctor and with an allergist on an outpatient basis.  Return to emergency department immediately for any new or worsening symptoms.

## 2023-12-31 DIAGNOSIS — M1991 Primary osteoarthritis, unspecified site: Secondary | ICD-10-CM | POA: Diagnosis not present

## 2023-12-31 DIAGNOSIS — E663 Overweight: Secondary | ICD-10-CM | POA: Diagnosis not present

## 2023-12-31 DIAGNOSIS — L509 Urticaria, unspecified: Secondary | ICD-10-CM | POA: Diagnosis not present

## 2023-12-31 DIAGNOSIS — M47814 Spondylosis without myelopathy or radiculopathy, thoracic region: Secondary | ICD-10-CM | POA: Diagnosis not present

## 2023-12-31 DIAGNOSIS — Z6827 Body mass index (BMI) 27.0-27.9, adult: Secondary | ICD-10-CM | POA: Diagnosis not present

## 2023-12-31 DIAGNOSIS — M5114 Intervertebral disc disorders with radiculopathy, thoracic region: Secondary | ICD-10-CM | POA: Diagnosis not present

## 2023-12-31 DIAGNOSIS — E039 Hypothyroidism, unspecified: Secondary | ICD-10-CM | POA: Diagnosis not present

## 2023-12-31 DIAGNOSIS — I10 Essential (primary) hypertension: Secondary | ICD-10-CM | POA: Diagnosis not present

## 2024-01-15 DIAGNOSIS — E2749 Other adrenocortical insufficiency: Secondary | ICD-10-CM | POA: Diagnosis not present

## 2024-01-26 DIAGNOSIS — E2749 Other adrenocortical insufficiency: Secondary | ICD-10-CM | POA: Diagnosis not present

## 2024-01-26 DIAGNOSIS — Z6827 Body mass index (BMI) 27.0-27.9, adult: Secondary | ICD-10-CM | POA: Diagnosis not present

## 2024-02-07 DIAGNOSIS — I700 Atherosclerosis of aorta: Secondary | ICD-10-CM | POA: Diagnosis not present

## 2024-02-07 DIAGNOSIS — F411 Generalized anxiety disorder: Secondary | ICD-10-CM | POA: Diagnosis not present

## 2024-02-07 DIAGNOSIS — M5114 Intervertebral disc disorders with radiculopathy, thoracic region: Secondary | ICD-10-CM | POA: Diagnosis not present

## 2024-02-11 DIAGNOSIS — I10 Essential (primary) hypertension: Secondary | ICD-10-CM | POA: Diagnosis not present

## 2024-02-11 DIAGNOSIS — F5104 Psychophysiologic insomnia: Secondary | ICD-10-CM | POA: Diagnosis not present

## 2024-02-11 DIAGNOSIS — Z79899 Other long term (current) drug therapy: Secondary | ICD-10-CM | POA: Diagnosis not present

## 2024-02-11 DIAGNOSIS — Z8669 Personal history of other diseases of the nervous system and sense organs: Secondary | ICD-10-CM | POA: Diagnosis not present

## 2024-02-11 DIAGNOSIS — Z79890 Hormone replacement therapy (postmenopausal): Secondary | ICD-10-CM | POA: Diagnosis not present

## 2024-02-11 DIAGNOSIS — Z87891 Personal history of nicotine dependence: Secondary | ICD-10-CM | POA: Diagnosis not present

## 2024-02-11 DIAGNOSIS — E782 Mixed hyperlipidemia: Secondary | ICD-10-CM | POA: Diagnosis not present

## 2024-02-11 DIAGNOSIS — Z7689 Persons encountering health services in other specified circumstances: Secondary | ICD-10-CM | POA: Diagnosis not present

## 2024-02-11 DIAGNOSIS — R7989 Other specified abnormal findings of blood chemistry: Secondary | ICD-10-CM | POA: Diagnosis not present

## 2024-02-11 DIAGNOSIS — Z6826 Body mass index (BMI) 26.0-26.9, adult: Secondary | ICD-10-CM | POA: Diagnosis not present

## 2024-02-11 DIAGNOSIS — E039 Hypothyroidism, unspecified: Secondary | ICD-10-CM | POA: Diagnosis not present

## 2024-03-14 DIAGNOSIS — E782 Mixed hyperlipidemia: Secondary | ICD-10-CM | POA: Diagnosis not present

## 2024-03-14 DIAGNOSIS — I10 Essential (primary) hypertension: Secondary | ICD-10-CM | POA: Diagnosis not present

## 2024-03-14 DIAGNOSIS — E039 Hypothyroidism, unspecified: Secondary | ICD-10-CM | POA: Diagnosis not present

## 2024-03-14 DIAGNOSIS — Z6826 Body mass index (BMI) 26.0-26.9, adult: Secondary | ICD-10-CM | POA: Diagnosis not present

## 2024-03-14 DIAGNOSIS — R7989 Other specified abnormal findings of blood chemistry: Secondary | ICD-10-CM | POA: Diagnosis not present

## 2024-03-15 DIAGNOSIS — I10 Essential (primary) hypertension: Secondary | ICD-10-CM | POA: Diagnosis not present

## 2024-03-15 DIAGNOSIS — R7989 Other specified abnormal findings of blood chemistry: Secondary | ICD-10-CM | POA: Diagnosis not present

## 2024-03-15 DIAGNOSIS — Z6826 Body mass index (BMI) 26.0-26.9, adult: Secondary | ICD-10-CM | POA: Diagnosis not present

## 2024-03-15 DIAGNOSIS — E039 Hypothyroidism, unspecified: Secondary | ICD-10-CM | POA: Diagnosis not present

## 2024-03-15 DIAGNOSIS — E782 Mixed hyperlipidemia: Secondary | ICD-10-CM | POA: Diagnosis not present

## 2024-03-25 DIAGNOSIS — E039 Hypothyroidism, unspecified: Secondary | ICD-10-CM | POA: Diagnosis not present

## 2024-03-25 DIAGNOSIS — Z713 Dietary counseling and surveillance: Secondary | ICD-10-CM | POA: Diagnosis not present

## 2024-03-25 DIAGNOSIS — E782 Mixed hyperlipidemia: Secondary | ICD-10-CM | POA: Diagnosis not present

## 2024-03-25 DIAGNOSIS — F5104 Psychophysiologic insomnia: Secondary | ICD-10-CM | POA: Diagnosis not present

## 2024-03-25 DIAGNOSIS — Z7182 Exercise counseling: Secondary | ICD-10-CM | POA: Diagnosis not present

## 2024-03-25 DIAGNOSIS — R7989 Other specified abnormal findings of blood chemistry: Secondary | ICD-10-CM | POA: Diagnosis not present

## 2024-03-25 DIAGNOSIS — M79672 Pain in left foot: Secondary | ICD-10-CM | POA: Diagnosis not present

## 2024-03-25 DIAGNOSIS — Z6826 Body mass index (BMI) 26.0-26.9, adult: Secondary | ICD-10-CM | POA: Diagnosis not present

## 2024-03-25 DIAGNOSIS — Z87891 Personal history of nicotine dependence: Secondary | ICD-10-CM | POA: Diagnosis not present

## 2024-03-25 DIAGNOSIS — Z8669 Personal history of other diseases of the nervous system and sense organs: Secondary | ICD-10-CM | POA: Diagnosis not present

## 2024-03-25 DIAGNOSIS — R7303 Prediabetes: Secondary | ICD-10-CM | POA: Diagnosis not present

## 2024-03-25 DIAGNOSIS — I10 Essential (primary) hypertension: Secondary | ICD-10-CM | POA: Diagnosis not present

## 2024-03-29 ENCOUNTER — Encounter: Payer: Self-pay | Admitting: Dermatology

## 2024-03-29 ENCOUNTER — Ambulatory Visit (INDEPENDENT_AMBULATORY_CARE_PROVIDER_SITE_OTHER): Admitting: Dermatology

## 2024-03-29 DIAGNOSIS — D692 Other nonthrombocytopenic purpura: Secondary | ICD-10-CM | POA: Diagnosis not present

## 2024-03-29 DIAGNOSIS — L578 Other skin changes due to chronic exposure to nonionizing radiation: Secondary | ICD-10-CM | POA: Diagnosis not present

## 2024-03-29 DIAGNOSIS — L820 Inflamed seborrheic keratosis: Secondary | ICD-10-CM | POA: Diagnosis not present

## 2024-03-29 DIAGNOSIS — L821 Other seborrheic keratosis: Secondary | ICD-10-CM

## 2024-03-29 DIAGNOSIS — W908XXA Exposure to other nonionizing radiation, initial encounter: Secondary | ICD-10-CM | POA: Diagnosis not present

## 2024-03-29 DIAGNOSIS — L720 Epidermal cyst: Secondary | ICD-10-CM

## 2024-03-29 DIAGNOSIS — Z85828 Personal history of other malignant neoplasm of skin: Secondary | ICD-10-CM

## 2024-03-29 DIAGNOSIS — D225 Melanocytic nevi of trunk: Secondary | ICD-10-CM | POA: Diagnosis not present

## 2024-03-29 DIAGNOSIS — D1801 Hemangioma of skin and subcutaneous tissue: Secondary | ICD-10-CM

## 2024-03-29 DIAGNOSIS — Z872 Personal history of diseases of the skin and subcutaneous tissue: Secondary | ICD-10-CM

## 2024-03-29 DIAGNOSIS — L905 Scar conditions and fibrosis of skin: Secondary | ICD-10-CM

## 2024-03-29 NOTE — Patient Instructions (Addendum)

## 2024-03-29 NOTE — Progress Notes (Signed)
 Follow-Up Visit   Subjective  Shelly Wood is a 68 y.o. female who presents for the following: AK f/u face, 5FU/Calcipotriene to R lat nasal medial bridge bid x 5 days with good reaction previously txted with LN2 and 5FU/Calcipotriene,  check spot R dorsum hand ~20m, irritating, check spot on back rough and dry  The following portions of the chart were reviewed this encounter and updated as appropriate: medications, allergies, medical history  Review of Systems:  No other skin or systemic complaints except as noted in HPI or Assessment and Plan.  Objective  Well appearing patient in no apparent distress; mood and affect are within normal limits.  A focused examination was performed of the following areas: Face, arms, back  Relevant exam findings are noted in the Assessment and Plan.  L mid back x 1, R dorsum hand x 2 (3) Stuck on waxy paps with erythema  Assessment & Plan   HISTORY OF PRECANCEROUS ACTINIC KERATOSIS R lat nasal medial bridge - site(s) of PreCancerous Actinic Keratosis clear today. - these may recur and new lesions may form requiring treatment to prevent transformation into skin cancer - observe for new or changing spots and contact [facility] for appointment if occur - photoprotection with sun protective clothing; sunglasses and broad spectrum sunscreen with SPF of at least 30 + and frequent self skin exams recommended - yearly exams by a dermatologist recommended for persons with history of PreCancerous Actinic Keratoses   SEBORRHEIC KERATOSIS back - Stuck-on, waxy, tan-brown papules and/or plaques  - Benign-appearing - Discussed benign etiology and prognosis. - Observe - Call for any changes  MELANOCYTIC NEVI back Exam: Tan-brown and/or pink-flesh-colored symmetric macules and papules Treatment Plan: Benign appearing on exam today. Recommend observation. Call clinic for new or changing moles. Recommend daily use of broad spectrum spf 30+  sunscreen to sun-exposed areas.   HEMANGIOMA back Exam: red papule(s) Discussed benign nature. Recommend observation. Call for changes.  ACNE VULGARIS back Exam: Open comedones back Treatment Plan: No treatment at this time  Purpura - Chronic; persistent and recurrent.  Treatable, but not curable. arms - Violaceous macules and patches - Benign - Related to trauma, age, sun damage and/or use of blood thinners, chronic use of topical and/or oral steroids - Observe - Can use OTC arnica containing moisturizer such as Dermend Bruise Formula if desired - Call for worsening or other concerns  ACTINIC DAMAGE - chronic, secondary to cumulative UV radiation exposure/sun exposure over time - diffuse scaly erythematous macules with underlying dyspigmentation - Recommend daily broad spectrum sunscreen SPF 30+ to sun-exposed areas, reapply every 2 hours as needed.  - Recommend staying in the shade or wearing long sleeves, sun glasses (UVA+UVB protection) and wide brim hats (4-inch brim around the entire circumference of the hat). - Call for new or changing lesions.   SCAR By history from patient secondary to [facility] treatment R wrist Exam: Dyspigmented smooth macule or patch. Benign-appearing.  Observation.  Call clinic for new or changing lesions. Recommend daily broad spectrum sunscreen SPF 30+, reapply every 2 hours as needed. Treatment: Recommend Serica moisturizing scar formula cream every night or [employer] brand or Mederma silicone scar sheet every night for the first year after a scar appears to help with scar remodeling if desired. Scars remodel on their own for a full year and will gradually improve in appearance over time.  INFLAMED SEBORRHEIC KERATOSIS (3) L mid back x 1, R dorsum hand x 2 (3) Symptomatic, irritating, patient  would like treated. Destruction of lesion - L mid back x 1, R dorsum hand x 2 (3) Complexity: simple   Destruction method: cryotherapy   Informed consent:  discussed and consent obtained   Timeout:  patient name, date of birth, surgical site, and procedure verified Lesion destroyed using liquid nitrogen: Yes   Region frozen until ice ball extended beyond lesion: Yes   Outcome: patient tolerated procedure well with no complications   Post-procedure details: wound care instructions given     Return in about 1 year (around 03/29/2025) for TBSE, Hx of AKs, Hx of BCC.  I, Grayce Saunas, RMA, am acting as scribe for Alm Rhyme, MD .   Documentation: I have reviewed the above documentation for accuracy and completeness, and I agree with the above.  Alm Rhyme, MD

## 2024-04-08 ENCOUNTER — Ambulatory Visit (INDEPENDENT_AMBULATORY_CARE_PROVIDER_SITE_OTHER)

## 2024-04-08 ENCOUNTER — Ambulatory Visit: Payer: Self-pay | Admitting: Podiatry

## 2024-04-08 DIAGNOSIS — M7752 Other enthesopathy of left foot: Secondary | ICD-10-CM | POA: Diagnosis not present

## 2024-04-19 NOTE — Progress Notes (Signed)
   Chief Complaint  Patient presents with   Toe Pain    feels like stone bruise under toe, painful causes her to limphe has pain between the 2nd and 3rd toe on the left foot x 3 months. Doesn't remember any trauma. Hasn't noticed a difference with shoe changes. Pain scale 7/10    HPI: 68 y.o. female presenting today as a new patient for above complaint  Past Medical History:  Diagnosis Date   Actinic keratosis    Hypertension    Migraine    Skin cancer    R wrist, BCC txted by Dr. Shona   Thyroid  disease    Vertigo     Past Surgical History:  Procedure Laterality Date   CHOLECYSTECTOMY      Allergies  Allergen Reactions   Prednisone Swelling     Physical Exam: General: The patient is alert and oriented x3 in no acute distress.  Dermatology: Skin is warm, dry and supple bilateral lower extremities.   Vascular: Palpable pedal pulses bilaterally. Capillary refill within normal limits.  No appreciable edema.  No erythema.  Neurological: Grossly intact via light touch  Musculoskeletal Exam: No pedal deformities noted.  There is some tenderness to palpation along the lesser MTPs of the left forefoot  Radiographic Exam LT foot 04/08/2024:  Normal osseous mineralization. Joint spaces preserved.  No fractures or osseous irregularities noted.  Impression: Negative  Assessment/Plan of Care: 1.  Metatarsophalangeal capsulitis left  -Patient evaluated.  X-rays reviewed -Recommend conservative treatment and care. -Advised against going barefoot.  Recommend good supportive tennis shoes and sneakers that support the medial longitudinal arch of the foot and offload pressure from the forefoot -Refrain from going barefoot -Return to clinic PRN     Thresa EMERSON Sar, DPM [facility]  Dr. Thresa EMERSON Sar, DPM    [address] [place], [place] [address]                Office [phone]  Fax [phone]

## 2025-04-04 ENCOUNTER — Encounter: Admitting: Dermatology
